# Patient Record
Sex: Female | Born: 1995 | Hispanic: Yes | Marital: Single | Smoking: Never smoker
Health system: Southern US, Community
[De-identification: ages and names within clinical notes are randomized; demographics above are authoritative.]

## PROBLEM LIST (undated history)

## (undated) ENCOUNTER — Inpatient Hospital Stay (HOSPITAL_COMMUNITY): Payer: Self-pay

## (undated) DIAGNOSIS — Z789 Other specified health status: Secondary | ICD-10-CM

## (undated) DIAGNOSIS — O139 Gestational [pregnancy-induced] hypertension without significant proteinuria, unspecified trimester: Secondary | ICD-10-CM

## (undated) HISTORY — DX: Gestational (pregnancy-induced) hypertension without significant proteinuria, unspecified trimester: O13.9

## (undated) HISTORY — PX: DILATION AND EVACUATION: SHX1459

## (undated) HISTORY — PX: NO PAST SURGERIES: SHX2092

---

## 2017-10-09 NOTE — L&D Delivery Note (Addendum)
Delivery Note At 10:54 AM a viable female was delivered via Vaginal, Spontaneous (Presentation: cephalic; ROA ).  APGAR: 8, 9; weight  .   Placenta status: spontaneous, intact.  Cord: 3VC  Anesthesia:  epidural Episiotomy: None Lacerations: 1st degree Suture Repair: 2.0 vicryl rapide Est. Blood Loss (mL):   Mom to postpartum.  Baby to Couplet care / Skin to Skin.  Upon arrival patient was complete and pushing. She pushed with good maternal effort to deliver a healthy baby boy. Baby delivered without difficulty with body cord x1, was noted to have good tone and place on maternal abdomen for oral suctioning, drying and stimulation. Delayed cord clamping performed. Placenta delivered intact with 3V cord. Vaginal canal and perineum was inspected and found to have one hemostatic laceration of the right vaginal wall and a first degree perineal laceration that was repaired with a figure 8. Pitocin was started and uterus massaged until bleeding slowed. Counts of sharps, instruments, and lap pads were all correct.   Mirian Mo, MD, PGY-1 06/14/2018, 11:13 AM  Midwife attestation: I was gloved and present for delivery in its entirety and I agree with the above resident's note.  Donette Larry, CNM 2:54 PM

## 2018-05-08 ENCOUNTER — Inpatient Hospital Stay (HOSPITAL_COMMUNITY)
Admission: AD | Admit: 2018-05-08 | Discharge: 2018-05-08 | Disposition: A | Discharge status: Home / Self Care | Payer: Medicaid Other | Source: Ambulatory Visit | Attending: Family Medicine | Admitting: Family Medicine

## 2018-05-08 ENCOUNTER — Encounter: Payer: Self-pay | Admitting: Student

## 2018-05-08 ENCOUNTER — Other Ambulatory Visit: Payer: Self-pay

## 2018-05-08 DIAGNOSIS — Z0371 Encounter for suspected problem with amniotic cavity and membrane ruled out: Secondary | ICD-10-CM

## 2018-05-08 DIAGNOSIS — R102 Pelvic and perineal pain: Secondary | ICD-10-CM | POA: Diagnosis present

## 2018-05-08 DIAGNOSIS — O0933 Supervision of pregnancy with insufficient antenatal care, third trimester: Secondary | ICD-10-CM | POA: Diagnosis not present

## 2018-05-08 DIAGNOSIS — Z3A33 33 weeks gestation of pregnancy: Secondary | ICD-10-CM | POA: Diagnosis not present

## 2018-05-08 DIAGNOSIS — O4703 False labor before 37 completed weeks of gestation, third trimester: Secondary | ICD-10-CM

## 2018-05-08 HISTORY — DX: Other specified health status: Z78.9

## 2018-05-08 LAB — URINALYSIS, ROUTINE W REFLEX MICROSCOPIC
BILIRUBIN URINE: NEGATIVE
Glucose, UA: NEGATIVE mg/dL
HGB URINE DIPSTICK: NEGATIVE
KETONES UR: 20 mg/dL — AB
Leukocytes, UA: NEGATIVE
NITRITE: NEGATIVE
Protein, ur: NEGATIVE mg/dL
SPECIFIC GRAVITY, URINE: 1.004 — AB (ref 1.005–1.030)
pH: 9 — ABNORMAL HIGH (ref 5.0–8.0)

## 2018-05-08 LAB — WET PREP, GENITAL
Clue Cells Wet Prep HPF POC: NONE SEEN
Trich, Wet Prep: NONE SEEN
YEAST WET PREP: NONE SEEN

## 2018-05-08 LAB — RAPID URINE DRUG SCREEN, HOSP PERFORMED
Amphetamines: NOT DETECTED
BARBITURATES: NOT DETECTED
Benzodiazepines: NOT DETECTED
COCAINE: NOT DETECTED
OPIATES: NOT DETECTED
TETRAHYDROCANNABINOL: NOT DETECTED

## 2018-05-08 LAB — AMNISURE RUPTURE OF MEMBRANE (ROM) NOT AT ARMC: Amnisure ROM: NEGATIVE

## 2018-05-08 LAB — POCT FERN TEST: POCT FERN TEST: NEGATIVE

## 2018-05-08 MED ORDER — NIFEDIPINE 10 MG PO CAPS
10.0000 mg | ORAL_CAPSULE | ORAL | Status: DC | PRN
  Administered 2018-05-08: 10 mg via ORAL
  Filled 2018-05-08: qty 1

## 2018-05-08 NOTE — MAU Provider Note (Signed)
Chief Complaint:  Contractions   First Provider Initiated Contact with Patient 05/08/18 1447     HPI: Christine Macdonald is a 22 y.o. G1P0 at [redacted]w[redacted]d who presents to maternity admissions via EMS reporting pelvic pressure & vaginal discharge. Symptoms began while at the swimming pool today. Noticed leaking of fluid after getting out of the pool. Can't tell if leaking has continued. Also reports generalized abdominal pain and abdominal tightening that comes and goes. Can't tell how frequent it is. Denies vaginal bleeding, n/v/d, or dysuria. Positive fetal movement.  Moved from [place] in April & has not been able to start prenatal care since being in the area.  Has not eaten since last night but states she has been drinking water while at the pool.   Location: abdomen Quality: sharp Severity: 10/10 in pain scale Duration: 1 hour Timing: intermittent Modifying factors: none Associated signs and symptoms: leaking of fluid  Pregnancy Course:   Past Medical History:  Diagnosis Date  . Medical history non-contributory    OB History  Gravida Para Term Preterm AB Living  1            SAB TAB Ectopic Multiple Live Births               # Outcome Date GA Lbr Len/2nd Weight Sex Delivery Anes PTL Lv  1 Current            Past Surgical History:  Procedure Laterality Date  . NO PAST SURGERIES     No family history on file. Social History   Tobacco Use  . Smoking status: Not on file  Substance Use Topics  . Alcohol use: Not on file  . Drug use: Not on file   No Known Allergies No medications prior to admission.    I have reviewed patient's Past Medical Hx, Surgical Hx, Family Hx, Social Hx, medications and allergies.   ROS:  Review of Systems  Physical Exam   Patient Vitals for the past 24 hrs:  BP Temp Temp src Pulse Resp  05/08/18 1725 116/72 98.7 F (37.1 C) Oral (!) 106 18  05/08/18 1444 123/69 98.3 F (36.8 C) Oral 82 20    Constitutional: Well-developed,  well-nourished female in no acute distress.  Cardiovascular: normal rate & rhythm, no murmur Respiratory: normal effort, lung sounds clear throughout GI: Abd soft, non-tender, gravid appropriate for gestational age. Pos BS x 4 MS: Extremities nontender, no edema, normal ROM Neurologic: Alert and oriented x 4.  GU:      Pelvic: No pooling of fluid. Some thick white discharge. Cervix pink &  smooth.   Dilation: Closed Effacement (%): Thick Cervical Position: Middle Presentation: Undeterminable Exam by:: E Lawerence NP  NST:  Baseline: 135 bpm, Variability: Good {> 6 bpm), Accelerations: Reactive and Decelerations: Absent   Labs: Results for orders placed or performed during the hospital encounter of 05/08/18 (from the past 24 hour(s))  POCT fern test     Status: None   Collection Time: 05/08/18  3:07 PM  Result Value Ref Range   POCT Fern Test Negative = intact amniotic membranes   Wet prep, genital     Status: Abnormal   Collection Time: 05/08/18  3:25 PM  Result Value Ref Range   Yeast Wet Prep HPF POC NONE SEEN NONE SEEN   Trich, Wet Prep NONE SEEN NONE SEEN   Clue Cells Wet Prep HPF POC NONE SEEN NONE SEEN   WBC, Wet Prep HPF POC MODERATE (A) NONE  SEEN   Sperm MANY BACTERIA SEEN   Amnisure rupture [facility] membrane (rom)not [facility]     Status: None   Collection Time: 05/08/18  3:35 PM  Result Value Ref Range   Amnisure ROM NEGATIVE   Urinalysis, Routine w reflex microscopic     Status: Abnormal   Collection Time: 05/08/18  4:12 PM  Result Value Ref Range   Color, Urine STRAW (A) YELLOW   APPearance CLEAR CLEAR   Specific Gravity, Urine 1.004 (L) 1.005 - 1.030   pH 9.0 (H) 5.0 - 8.0   Glucose, UA NEGATIVE NEGATIVE mg/dL   Hgb urine dipstick NEGATIVE NEGATIVE   Bilirubin Urine NEGATIVE NEGATIVE   Ketones, ur 20 (A) NEGATIVE mg/dL   Protein, ur NEGATIVE NEGATIVE mg/dL   Nitrite NEGATIVE NEGATIVE   Leukocytes, UA NEGATIVE NEGATIVE  Rapid urine drug screen (hospital performed)      Status: None   Collection Time: 05/08/18  4:14 PM  Result Value Ref Range   Opiates NONE DETECTED NONE DETECTED   Cocaine NONE DETECTED NONE DETECTED   Benzodiazepines NONE DETECTED NONE DETECTED   Amphetamines NONE DETECTED NONE DETECTED   Tetrahydrocannabinol NONE DETECTED NONE DETECTED   Barbiturates NONE DETECTED NONE DETECTED    Imaging:  No results found.  MAU Course: Orders Placed This Encounter  Procedures  . Wet prep, genital  . Culture, beta strep (group b only)  . [facility]  . Urinalysis, Routine w reflex microscopic  . Rapid urine drug screen (hospital performed)  . POCT fern test  . Discharge patient   Meds ordered this encounter  Medications  . NIFEdipine (PROCARDIA) capsule 10 mg    MDM: Reactive tracing SSE performed, no pooling of fluid, fern negative Cervix closed/thick Initially EDD made pt 377w5d, pt states final DD given by her OB in [place] based on ultrasound is 9/13 making her 3625w5d today Procardia 10 mg PO x 1 dose given & pt reports resolution of pain GC/CT, wet prep, UDS collected  Assessment: 1. Preterm uterine contractions in third trimester, antepartum   2. [redacted] weeks gestation of pregnancy   3. Limited prenatal care in third trimester   4. Encounter for suspected PROM, with rupture of membranes not found     Plan: Discharge home in stable condition.  Preterm Labor precautions and fetal kick counts Msg sent to office to schedule new ob visit  Follow-up Information    [facility]   Specialty:  Obstetrics and Gynecology Contact information: [address] [place] [id number] [phone]          Allergies as of 05/08/2018   No Known Allergies     Medication List    You have not been prescribed any medications.     Judeth HornLawrence, Reena Borromeo, NP 05/08/2018 6:45 PM

## 2018-05-08 NOTE — Discharge Instructions (Signed)

## 2018-05-08 NOTE — MAU Note (Signed)
Pt states she began feeling UCs approx 1hr ago.  Pt arrived via EMS & states that upon transfer to MAU stretcher she noticed a gush of fluid.  On visual exam, no fluid noted at perineum.  States +FM.  Has not had PNC since 01/2018 d/t insurance reasons.

## 2018-05-10 LAB — CULTURE, BETA STREP (GROUP B ONLY)

## 2018-05-10 LAB — GC/CHLAMYDIA PROBE AMP (~~LOC~~) NOT AT ARMC
CHLAMYDIA, DNA PROBE: NEGATIVE
Neisseria Gonorrhea: NEGATIVE

## 2018-05-21 ENCOUNTER — Ambulatory Visit (INDEPENDENT_AMBULATORY_CARE_PROVIDER_SITE_OTHER): Payer: Medicaid Other | Admitting: Student

## 2018-05-21 ENCOUNTER — Encounter: Payer: Self-pay | Admitting: Student

## 2018-05-21 ENCOUNTER — Ambulatory Visit: Payer: Self-pay | Admitting: Clinical

## 2018-05-21 VITALS — BP 126/86 | HR 100 | Ht 62.0 in | Wt 156.0 lb

## 2018-05-21 DIAGNOSIS — Z3403 Encounter for supervision of normal first pregnancy, third trimester: Secondary | ICD-10-CM | POA: Diagnosis present

## 2018-05-21 DIAGNOSIS — Z3400 Encounter for supervision of normal first pregnancy, unspecified trimester: Secondary | ICD-10-CM

## 2018-05-21 DIAGNOSIS — R030 Elevated blood-pressure reading, without diagnosis of hypertension: Secondary | ICD-10-CM | POA: Diagnosis not present

## 2018-05-21 DIAGNOSIS — Z23 Encounter for immunization: Secondary | ICD-10-CM

## 2018-05-21 DIAGNOSIS — K5900 Constipation, unspecified: Secondary | ICD-10-CM | POA: Diagnosis not present

## 2018-05-21 LAB — POCT URINALYSIS DIP (DEVICE)
BILIRUBIN URINE: NEGATIVE
GLUCOSE, UA: NEGATIVE mg/dL
HGB URINE DIPSTICK: NEGATIVE
Ketones, ur: NEGATIVE mg/dL
Nitrite: NEGATIVE
Protein, ur: NEGATIVE mg/dL
Specific Gravity, Urine: 1.01 (ref 1.005–1.030)
UROBILINOGEN UA: 0.2 mg/dL (ref 0.0–1.0)
pH: 7.5 (ref 5.0–8.0)

## 2018-05-21 NOTE — Progress Notes (Signed)
Subjective:   Christine Macdonald is a 22 y.o. G1P0 at 31w4dby --/19 being seen today for her first obstetrical visit.  Her obstetrical history is significant for none. Patient does intend to breast feed. Pregnancy history fully reviewed.  Patient reports no complaints. She has had her glucose drink and reports recent episode of nausea. Also has trouble with constipation. Has had this outside of pregnancy but worsening. Usually goes once per week.   HISTORY: OB History  Gravida Para Term Preterm AB Living  1 0 0 0 0 0  SAB TAB Ectopic Multiple Live Births  0 0 0 0 0    # Outcome Date GA Lbr Len/2nd Weight Sex Delivery Anes PTL Lv  1 Current            Last pap smear - unknown. Patient needs postpartum pap smear.   Past Medical History:  Diagnosis Date  . Medical history non-contributory    Past Surgical History:  Procedure Laterality Date  . NO PAST SURGERIES     History reviewed. No pertinent family history. Social History   Tobacco Use  . Smoking status: Never Smoker  . Smokeless tobacco: Never Used  Substance Use Topics  . Alcohol use: Not Currently  . Drug use: Never   Allergies  Allergen Reactions  . Quetiapine Fumarate Hives   Current Outpatient Medications on File Prior to Visit  Medication Sig Dispense Refill  . PRENATAL 27-1 MG TABS Take 1 tablet by mouth daily.     No current facility-administered medications on file prior to visit.    Exam   Vitals:   05/21/18 0849 05/21/18 0850 05/21/18 0906  BP: (!) 146/101  126/86  Pulse: (!) 103  100  Weight: 156 lb (70.8 kg)    Height:  _0  (1.575 m)    Fetal Heart Rate (bpm): 138  Fundal Height: 35   Pelvic Exam:  deferred   Bony Pelvis: average  System: General: well-developed, well-nourished female in no acute distress   Breast:  deferred    Skin: normal coloration and turgor, no rashes   Neurologic: oriented, normal, negative, normal mood   Extremities: normal strength, tone, and muscle  mass, ROM of all joints is normal   HEENT PERRL, extraocular movement intact and sclera clear, anicteric   Mouth/Teeth mucous membranes moist, pharynx normal without lesions and dental hygiene good   Neck supple and no masses   Cardiovascular: Regular heart rate   Respiratory:  no respiratory distress   Abdomen: soft, non-tender     Assessment:   Pregnancy: G1P0 Patient Active Problem List   Diagnosis Date Noted  . Supervision of normal first pregnancy, antepartum 05/21/2018  . Elevated BP without diagnosis of hypertension 05/21/2018    Plan:  1. Encounter for supervision of normal first pregnancy in third trimester: [place] prenatal [place]. [facility]  - Obstetric Panel, Including HIV - SMN1 COPY NUMBER ANALYSIS (SMA Carrier Screen) - Hemoglobinopathy Evaluation - Cystic fibrosis gene test - Glucose Tolerance, 1 Hour - attempted but patient vomited Glucola so A1C drawn  - [place] OB COMP + 14 WK; Future - Tdap today - will need to repeat GBS swab towards end of pregnancy  - Continue prenatal vitamins - ultrasound discussed - to be done next week  - The nature of [facility] MDs and other Advanced Practice Providers was explained  to patient; also emphasized that residents, students are part of our team. Routine obstetric precautions reviewed. - met with clinical social worker to review resources  - Problem list reviewed and updated.  2. Elevated BP without diagnosis of hypertension: Initially elevated blood pressure 146/101 but normal on repeat. Patient reports history of white-coat hypertension. Denies headaches, vision changes, abdominal pain, LE swelling.  - Comp Met (CMET) - Protein / creatinine ratio, urine  3. Constipation: Acute on chronic.  - recommended hydration, dried apricots - discussed use of medications like Colace if worsening  Lynton Crescenzo S. Juleen China,  DO

## 2018-05-21 NOTE — Patient Instructions (Signed)
Third Trimester of Pregnancy The third trimester is from week 28 through week 40 (months 7 through 9). The third trimester is a time when the unborn baby (fetus) is growing rapidly. At the end of the ninth month, the fetus is about 20 inches in length and weighs 6-10 pounds. Body changes during your third trimester Your body will continue to go through many changes during pregnancy. The changes vary from woman to woman. During the third trimester:  Your weight will continue to increase. You can expect to gain 25-35 pounds (11-16 kg) by the end of the pregnancy.  You may begin to get stretch marks on your hips, abdomen, and breasts.  You may urinate more often because the fetus is moving lower into your pelvis and pressing on your bladder.  You may develop or continue to have heartburn. This is caused by increased hormones that slow down muscles in the digestive tract.  You may develop or continue to have constipation because increased hormones slow digestion and cause the muscles that push waste through your intestines to relax.  You may develop hemorrhoids. These are swollen veins (varicose veins) in the rectum that can itch or be painful.  You may develop swollen, bulging veins (varicose veins) in your legs.  You may have increased body aches in the pelvis, back, or thighs. This is due to weight gain and increased hormones that are relaxing your joints.  You may have changes in your hair. These can include thickening of your hair, rapid growth, and changes in texture. Some women also have hair loss during or after pregnancy, or hair that feels dry or thin. Your hair will most likely return to normal after your baby is born.  Your breasts will continue to grow and they will continue to become tender. A yellow fluid (colostrum) may leak from your breasts. This is the first milk you are producing for your baby.  Your belly button may stick out.  You may notice more swelling in your hands,  face, or ankles.  You may have increased tingling or numbness in your hands, arms, and legs. The skin on your belly may also feel numb.  You may feel short of breath because of your expanding uterus.  You may have more problems sleeping. This can be caused by the size of your belly, increased need to urinate, and an increase in your body's metabolism.  You may notice the fetus "dropping," or moving lower in your abdomen (lightening).  You may have increased vaginal discharge.  You may notice your joints feel loose and you may have pain around your pelvic bone.  What to expect at prenatal visits You will have prenatal exams every 2 weeks until week 36. Then you will have weekly prenatal exams. During a routine prenatal visit:  You will be weighed to make sure you and the baby are growing normally.  Your blood pressure will be taken.  Your abdomen will be measured to track your baby's growth.  The fetal heartbeat will be listened to.  Any test results from the previous visit will be discussed.  You may have a cervical check near your due date to see if your cervix has softened or thinned (effaced).  You will be tested for Group B streptococcus. This happens between 35 and 37 weeks.  Your health care provider may ask you:  What your birth plan is.  How you are feeling.  If you are feeling the baby move.  If you have had   any abnormal symptoms, such as leaking fluid, bleeding, severe headaches, or abdominal cramping.  If you are using any tobacco products, including cigarettes, chewing tobacco, and electronic cigarettes.  If you have any questions.  Other tests or screenings that may be performed during your third trimester include:  Blood tests that check for low iron levels (anemia).  Fetal testing to check the health, activity level, and growth of the fetus. Testing is done if you have certain medical conditions or if there are problems during the  pregnancy.  Nonstress test (NST). This test checks the health of your baby to make sure there are no signs of problems, such as the baby not getting enough oxygen. During this test, a belt is placed around your belly. The baby is made to move, and its heart rate is monitored during movement.  What is false labor? False labor is a condition in which you feel small, irregular tightenings of the muscles in the womb (contractions) that usually go away with rest, changing position, or drinking water. These are called Braxton Hicks contractions. Contractions may last for hours, days, or even weeks before true labor sets in. If contractions come at regular intervals, become more frequent, increase in intensity, or become painful, you should see your health care provider. What are the signs of labor?  Abdominal cramps.  Regular contractions that start at 10 minutes apart and become stronger and more frequent with time.  Contractions that start on the top of the uterus and spread down to the lower abdomen and back.  Increased pelvic pressure and dull back pain.  A watery or bloody mucus discharge that comes from the vagina.  Leaking of amniotic fluid. This is also known as your "water breaking." It could be a slow trickle or a gush. Let your health care provider know if it has a color or strange odor. If you have any of these signs, call your health care provider right away, even if it is before your due date. Follow these instructions at home: Medicines  Follow your health care provider's instructions regarding medicine use. Specific medicines may be either safe or unsafe to take during pregnancy.  Take a prenatal vitamin that contains at least 600 micrograms (mcg) of folic acid.  If you develop constipation, try taking a stool softener if your health care provider approves. Eating and drinking  Eat a balanced diet that includes fresh fruits and vegetables, whole grains, good sources of protein  such as meat, eggs, or tofu, and low-fat dairy. Your health care provider will help you determine the amount of weight gain that is right for you.  Avoid raw meat and uncooked cheese. These carry germs that can cause birth defects in the baby.  If you have low calcium intake from food, talk to your health care provider about whether you should take a daily calcium supplement.  Eat four or five small meals rather than three large meals a day.  Limit foods that are high in fat and processed sugars, such as fried and sweet foods.  To prevent constipation: ? Drink enough fluid to keep your urine clear or pale yellow. ? Eat foods that are high in fiber, such as fresh fruits and vegetables, whole grains, and beans. Activity  Exercise only as directed by your health care provider. Most women can continue their usual exercise routine during pregnancy. Try to exercise for 30 minutes at least 5 days a week. Stop exercising if you experience uterine contractions.  Avoid heavy   lifting.  Do not exercise in extreme heat or humidity, or at high altitudes.  Wear low-heel, comfortable shoes.  Practice good posture.  You may continue to have sex unless your health care provider tells you otherwise. Relieving pain and discomfort  Take frequent breaks and rest with your legs elevated if you have leg cramps or low back pain.  Take warm sitz baths to soothe any pain or discomfort caused by hemorrhoids. Use hemorrhoid cream if your health care provider approves.  Wear a good support bra to prevent discomfort from breast tenderness.  If you develop varicose veins: ? Wear support pantyhose or compression stockings as told by your healthcare provider. ? Elevate your feet for 15 minutes, 3-4 times a day. Prenatal care  Write down your questions. Take them to your prenatal visits.  Keep all your prenatal visits as told by your health care provider. This is important. Safety  Wear your seat belt at  all times when driving.  Make a list of emergency phone numbers, including numbers for family, friends, the hospital, and police and fire departments. General instructions  Avoid cat litter boxes and soil used by cats. These carry germs that can cause birth defects in the baby. If you have a cat, ask someone to clean the litter box for you.  Do not travel far distances unless it is absolutely necessary and only with the approval of your health care provider.  Do not use hot tubs, steam rooms, or saunas.  Do not drink alcohol.  Do not use any products that contain nicotine or tobacco, such as cigarettes and e-cigarettes. If you need help quitting, ask your health care provider.  Do not use any medicinal herbs or unprescribed drugs. These chemicals affect the formation and growth of the baby.  Do not douche or use tampons or scented sanitary pads.  Do not cross your legs for long periods of time.  To prepare for the arrival of your baby: ? Take prenatal classes to understand, practice, and ask questions about labor and delivery. ? Make a trial run to the hospital. ? Visit the hospital and tour the maternity area. ? Arrange for maternity or paternity leave through employers. ? Arrange for family and friends to take care of pets while you are in the hospital. ? Purchase a rear-facing car seat and make sure you know how to install it in your car. ? Pack your hospital bag. ? Prepare the baby's nursery. Make sure to remove all pillows and stuffed animals from the baby's crib to prevent suffocation.  Visit your dentist if you have not gone during your pregnancy. Use a soft toothbrush to brush your teeth and be gentle when you floss. Contact a health care provider if:  You are unsure if you are in labor or if your water has broken.  You become dizzy.  You have mild pelvic cramps, pelvic pressure, or nagging pain in your abdominal area.  You have lower back pain.  You have persistent  nausea, vomiting, or diarrhea.  You have an unusual or bad smelling vaginal discharge.  You have pain when you urinate. Get help right away if:  Your water breaks before 37 weeks.  You have regular contractions less than 5 minutes apart before 37 weeks.  You have a fever.  You are leaking fluid from your vagina.  You have spotting or bleeding from your vagina.  You have severe abdominal pain or cramping.  You have rapid weight loss or weight gain.    You have shortness of breath with chest pain.  You notice sudden or extreme swelling of your face, hands, ankles, feet, or legs.  Your baby makes fewer than 10 movements in 2 hours.  You have severe headaches that do not go away when you take medicine.  You have vision changes. Summary  The third trimester is from week 28 through week 40, months 7 through 9. The third trimester is a time when the unborn baby (fetus) is growing rapidly.  During the third trimester, your discomfort may increase as you and your baby continue to gain weight. You may have abdominal, leg, and back pain, sleeping problems, and an increased need to urinate.  During the third trimester your breasts will keep growing and they will continue to become tender. A yellow fluid (colostrum) may leak from your breasts. This is the first milk you are producing for your baby.  False labor is a condition in which you feel small, irregular tightenings of the muscles in the womb (contractions) that eventually go away. These are called Braxton Hicks contractions. Contractions may last for hours, days, or even weeks before true labor sets in.  Signs of labor can include: abdominal cramps; regular contractions that start at 10 minutes apart and become stronger and more frequent with time; watery or bloody mucus discharge that comes from the vagina; increased pelvic pressure and dull back pain; and leaking of amniotic fluid. This information is not intended to replace advice  given to you by your health care provider. Make sure you discuss any questions you have with your health care provider. Document Released: 09/19/2001 Document Revised: 03/02/2016 Document Reviewed: 11/26/2012 [employer] Interactive Patient Education  2017 [employer]  

## 2018-05-21 NOTE — BH Specialist Note (Deleted)
Error

## 2018-05-21 NOTE — BH Specialist Note (Signed)
Integrated Behavioral Health Initial Visit  MRN: [id number] Name: Christine Macdonald  Number of Integrated Behavioral Health Clinician visits:: 1/6 Session Start time: 9:28  Session End time: 9:38 Total time: 10 minutes  Type of Service: Integrated Behavioral Health- Individual/Family Interpretor:No. Interpretor Name and Language: n/a   Warm Hand Off Completed.       SUBJECTIVE: Christine Cavaabitha A Bogart is a 22 y.o. female accompanied by Partner/Significant Other Patient was referred by Judeth HornErin Lawrence, NP for initial OB intro to integrated behavioral health services. Patient reports the following symptoms/concerns: Patient states she has no particular concerns today.  Duration of problem: n/a; Severity of problem: n/a  OBJECTIVE: Mood: Normal and Affect: Appropriate Risk of harm to self or others: No plan to harm self or others  LIFE CONTEXT: Family and Social: - School/Work: - Self-Care: - Life Changes: Current pregnancy and move to [place] from [place]  GOALS ADDRESSED: n/a  INTERVENTIONS: Interventions utilized: Link to [employer]  Standardized Assessments completed: GAD-7 and PHQ 9  ASSESSMENT: Patient currently experiencing Supervision of normal first  Pregnancy, antepartum   Patient may benefit from initial OB introduction to integrated behavioral health services.  PLAN: 1. Follow up with behavioral health clinician on : As needed 2. Behavioral recommendations:  n/a 3. Referral(s): [facility] 4. "From scale of 1-10, how likely are you to follow plan?": 10  Rae LipsJamie C Maxine Huynh, LCSW  Depression screen [facility] 2/9 05/21/2018  Decreased Interest 0  Down, Depressed, Hopeless 0  PHQ - 2 Score 0  Altered sleeping 2  Tired, decreased energy 0  Change in appetite 1  Feeling bad or failure about yourself  0  Trouble concentrating 0  Moving slowly or fidgety/restless 0  Suicidal thoughts 0  PHQ-9 Score 3   GAD 7 : Generalized  Anxiety Score 05/21/2018  Nervous, Anxious, on Edge 0  Control/stop worrying 0  Worry too much - different things 0  Trouble relaxing 0  Restless 0  Easily annoyed or irritable 0  Afraid - awful might happen 0  Total GAD 7 Score 0

## 2018-05-22 LAB — PROTEIN / CREATININE RATIO, URINE
CREATININE, UR: 25.1 mg/dL
PROTEIN/CREAT RATIO: 159 mg/g{creat} (ref 0–200)
Protein, Ur: 4 mg/dL

## 2018-05-22 LAB — HEMOGLOBIN A1C
Est. average glucose Bld gHb Est-mCnc: 100 mg/dL
Hgb A1c MFr Bld: 5.1 % (ref 4.8–5.6)

## 2018-05-27 ENCOUNTER — Ambulatory Visit (HOSPITAL_COMMUNITY)
Admission: RE | Admit: 2018-05-27 | Discharge: 2018-05-27 | Disposition: A | Discharge status: Home / Self Care | Payer: Medicaid Other | Source: Ambulatory Visit | Attending: Student | Admitting: Student

## 2018-05-27 DIAGNOSIS — Z363 Encounter for antenatal screening for malformations: Secondary | ICD-10-CM | POA: Diagnosis not present

## 2018-05-27 DIAGNOSIS — O0932 Supervision of pregnancy with insufficient antenatal care, second trimester: Secondary | ICD-10-CM | POA: Insufficient documentation

## 2018-05-27 DIAGNOSIS — Z3403 Encounter for supervision of normal first pregnancy, third trimester: Secondary | ICD-10-CM

## 2018-05-27 DIAGNOSIS — Z3A36 36 weeks gestation of pregnancy: Secondary | ICD-10-CM

## 2018-05-28 ENCOUNTER — Ambulatory Visit (INDEPENDENT_AMBULATORY_CARE_PROVIDER_SITE_OTHER): Payer: Medicaid Other | Admitting: Student

## 2018-05-28 ENCOUNTER — Inpatient Hospital Stay (HOSPITAL_COMMUNITY)
Admission: AD | Admit: 2018-05-28 | Discharge: 2018-05-28 | Disposition: A | Discharge status: Home / Self Care | Payer: Medicaid Other | Source: Ambulatory Visit | Attending: Obstetrics and Gynecology | Admitting: Obstetrics and Gynecology

## 2018-05-28 VITALS — BP 122/85 | HR 91 | Wt 156.9 lb

## 2018-05-28 DIAGNOSIS — Z3A38 38 weeks gestation of pregnancy: Secondary | ICD-10-CM | POA: Insufficient documentation

## 2018-05-28 DIAGNOSIS — O26893 Other specified pregnancy related conditions, third trimester: Secondary | ICD-10-CM | POA: Insufficient documentation

## 2018-05-28 DIAGNOSIS — Z3400 Encounter for supervision of normal first pregnancy, unspecified trimester: Secondary | ICD-10-CM

## 2018-05-28 DIAGNOSIS — O479 False labor, unspecified: Secondary | ICD-10-CM

## 2018-05-28 DIAGNOSIS — O0933 Supervision of pregnancy with insufficient antenatal care, third trimester: Secondary | ICD-10-CM

## 2018-05-28 DIAGNOSIS — O0930 Supervision of pregnancy with insufficient antenatal care, unspecified trimester: Secondary | ICD-10-CM | POA: Insufficient documentation

## 2018-05-28 DIAGNOSIS — Z3403 Encounter for supervision of normal first pregnancy, third trimester: Secondary | ICD-10-CM

## 2018-05-28 DIAGNOSIS — R030 Elevated blood-pressure reading, without diagnosis of hypertension: Secondary | ICD-10-CM

## 2018-05-28 NOTE — Progress Notes (Addendum)
   PRENATAL VISIT NOTE  Subjective:  Christine Macdonald is a 22 y.o. G1P0 at 4460w4d being seen today for ongoing prenatal care.  She is currently monitored for the following issues for this low-risk pregnancy and has Supervision of normal first pregnancy, antepartum; Elevated BP without diagnosis of hypertension; Constipation; and Late prenatal care on their problem list.   Patient reports no complaints.  Contractions: Irritability. Vag. Bleeding: None.  Movement: Present. Denies leaking of fluid.   The following portions of the patient's history were reviewed and updated as appropriate: allergies, current medications, past family history, past medical history, past social history, past surgical history and problem list. Problem list updated.  Objective:   Vitals:   05/28/18 0827  BP: 122/85  Pulse: 91  Weight: 156 lb 14.4 oz (71.2 kg)    Fetal Status: Fetal Heart Rate (bpm): 142 Fundal Height: 37 cm Movement: Present     General:  Alert, oriented and cooperative. Patient is in no acute distress.  Skin: Skin is warm and dry. No rash noted.   Cardiovascular: Normal heart rate noted  Respiratory: Normal respiratory effort, no problems with respiration noted  Abdomen: Soft, gravid, appropriate for gestational age.  Pain/Pressure: Present     Pelvic: Cervical exam deferred        Extremities: Normal range of motion.  Edema: None  Mental Status: Normal mood and affect. Normal behavior. Normal judgment and thought content.   Assessment and Plan:  Pregnancy: G1P0 at 2060w4d  1. Supervision of normal first pregnancy, antepartum  - Per patient, gestational age was determined by anatomy scan at 18wks. LMP was unknown to mother. -Reviewed print out of labs (not yet in Epic); all normal. RN to abstract.  -Patient clarified she wants Depo -List of peds given -Reviewed US results; normal anatomy -BP reviewed, normal today   - F/u in 1 week  2. Poor prenatal care  Term labor symptoms and  general obstetric precautions including but not limited to vaginal bleeding, contractions, leaking of fluid and fetal movement were reviewed in detail with the patient. Please refer to After Visit Summary for other counseling recommendations.  Return in about 1 week (around 06/04/2018), or LROB.  Future Appointments  Date Time Provider Department Center  06/04/2018 10:55 AM Kathlene CoteWenzel, Julie N, PA-C [facility]  06/11/2018 10:55 AM Marvetta GibbonsBurleson, Brand Maleserri L, NP [facility]  06/18/2018 10:55 AM Marylene LandKooistra, Kathryn Lorraine, CNM [facility]  06/25/2018 10:55 AM Crisoforo OxfordKooistra, Charlesetta GaribaldiKathryn Lorraine, CNM [facility]    Margarita RanaHarrison D Ahmira Boisselle, Student-PA 9:05am  Patient doing well; no complaints. BP normal today; confirmed Depo shot. Anticipatory guidance about next weeks of pregnancy given. Return in 1 week.   I confirm that I have verified the information documented in the PA's note and that I have also personally reperformed the physical exam and all medical decision making activities. Luna KitchensKathryn Kooistra

## 2018-05-28 NOTE — Patient Instructions (Signed)
AREA PEDIATRIC/FAMILY PRACTICE PHYSICIANS  [facility] [address], [address] [place], [place]  [address] Phone - [phone]   Fax - [phone]  [facility] [address] [place], [place] [address] Phone - [phone]   Fax - [phone]  [facility] [address] [place], [place]  [address] Phone - [phone]   Fax - [phone]  [facility] [address] [place], [place]  [address] Phone - [phone]   Fax - [phone]  [facility] [address] [place], [place]  [address] Phone - [phone]   Fax - [phone]  [facility] [address] [place], [place]  [address] Phone - [phone]   Fax - [phone]  [facility] [address] [place], [place]  [address] Phone - [phone]   Fax - [phone]  [facility] [address] [place], [place]  [address] Phone - [phone]   Fax - [phone]  [facility] [address] [place], [place]  [address] Phone - [phone]   Fax - [phone] [facility] [address] [place], [place]  [address] Phone - [phone]   Fax - [phone]  [facility] [address] [place], [place]  [address] Phone - [phone]   Fax - [phone]  [facility] [address] [place], [place]  [address] Phone - [phone]   Fax - [phone]  [facility] [address] [place], [place]  [address] Phone - [phone]   Fax - [phone]  [facility] [address] [place], [place]  [address] Phone - [phone]  [facility] [address] [place], [place]  [address] Phone - [phone]   Fax - [phone]  [facility] [address] [place], [place]  [address] Phone - [phone]   Fax - [phone]  [facility] [address] [place], [place]  [address] Phone - [phone]   Fax - [phone]   [facility] [address] [place], [place]  [address] Phone - [phone]   Fax - [phone]   [facility] [address] [place], [place]  [address] Phone - [phone]   Fax - [phone]  [facility] [address] [place], [place]  [address] Phone - [phone]   Fax - [phone]  [facility] [address] [place], [place]  [address] Phone - [phone]   Fax - [phone]  [facility] [address] [place], [place]  [address] Phone - [phone]   Fax - [phone]  [facility] [address] [place], [place]  [address] Phone - [phone]   Fax - [phone] [facility] [address] [place], [place]  [address] Phone - [phone]   Fax - [phone]  [facility] [address] [place], [place]  [address] Phone - [phone]   Fax - [phone]  [facility] [address] [place], [place]  [address] Phone - [phone]   Fax - [phone]  [facility] Charlene Flemming MD [address] [place] [address] Phone [phone]  Fax [phone]   

## 2018-05-28 NOTE — MAU Note (Signed)
I have communicated with Dr. Parke SimmersBland and reviewed vital signs:  Vitals:   05/28/18 2005  BP: 123/85  Pulse: 90  Resp: 18  Temp: 98.5 F (36.9 C)  SpO2: 97%    Vaginal exam:  Dilation: Closed Effacement (%): Thick Presentation: Undeterminable Exam by:: Tylor Gambrill RN,   Also reviewed contraction pattern and that non-stress test is reactive.  It has been documented that patient reports one contractions in the 30 minutes she was on the monitor; cervix is closed not indicating active labor.  Patient denies any other complaints.  Based on this report provider has given order for discharge.  A discharge order and diagnosis entered by a provider.   Labor discharge instructions reviewed with patient.

## 2018-05-28 NOTE — Progress Notes (Signed)
Pt is having a hard time catching her breath. Past Week.

## 2018-05-28 NOTE — Discharge Summary (Signed)
Consulted about discharge for this patient with closed cervix and 1 contraction in last 30min.  No ROM, vital signs reassuring and fetal monitoring strip reviewed with Dr. Earlene PlaterWallace.  Patient cleared for discharge with return precautions issued by MAU  -Dr. Parke SimmersBland

## 2018-05-28 NOTE — MAU Note (Signed)
Pt here for complaints of contractions that started about 1 hour ago. States they are every 4-5 mins. Denies LOF or vaginal bleeding. Reports good fetal movement. States her cervix has not been examined.

## 2018-05-29 LAB — HEMOGLOBINOPATHY EVALUATION
FERRITIN: 10 ng/mL — AB (ref 15–150)
HGB C: 0 %
HGB F QUANT: 0 % (ref 0.0–2.0)
HGB S: 0 %
Hgb A2 Quant: 2 % (ref 1.8–3.2)
Hgb A: 98 % (ref 96.4–98.8)
Hgb Solubility: NEGATIVE
Hgb Variant: 0 %

## 2018-05-29 LAB — COMPREHENSIVE METABOLIC PANEL
ALT: 7 IU/L (ref 0–32)
AST: 12 IU/L (ref 0–40)
Albumin/Globulin Ratio: 1.2 (ref 1.2–2.2)
Albumin: 3.6 g/dL (ref 3.5–5.5)
Alkaline Phosphatase: 148 IU/L — ABNORMAL HIGH (ref 39–117)
BILIRUBIN TOTAL: 0.2 mg/dL (ref 0.0–1.2)
BUN/Creatinine Ratio: 6 — ABNORMAL LOW (ref 9–23)
BUN: 3 mg/dL — ABNORMAL LOW (ref 6–20)
CHLORIDE: 103 mmol/L (ref 96–106)
CO2: 19 mmol/L — ABNORMAL LOW (ref 20–29)
Calcium: 9.1 mg/dL (ref 8.7–10.2)
Creatinine, Ser: 0.5 mg/dL — ABNORMAL LOW (ref 0.57–1.00)
GFR calc non Af Amer: 137 mL/min/{1.73_m2} (ref >59)
GFR, EST AFRICAN AMERICAN: 158 mL/min/{1.73_m2} (ref >59)
GLUCOSE: 108 mg/dL — AB (ref 65–99)
Globulin, Total: 2.9 g/dL (ref 1.5–4.5)
Potassium: 3.6 mmol/L (ref 3.5–5.2)
Sodium: 137 mmol/L (ref 134–144)
TOTAL PROTEIN: 6.5 g/dL (ref 6.0–8.5)

## 2018-05-29 LAB — OBSTETRIC PANEL, INCLUDING HIV
ANTIBODY SCREEN: NEGATIVE
Basophils Absolute: 0 10*3/uL (ref 0.0–0.2)
Basos: 0 %
EOS (ABSOLUTE): 0.1 10*3/uL (ref 0.0–0.4)
Eos: 1 %
HEMATOCRIT: 32.9 % — AB (ref 34.0–46.6)
HIV SCREEN 4TH GENERATION: NONREACTIVE
Hemoglobin: 10.9 g/dL — ABNORMAL LOW (ref 11.1–15.9)
Hepatitis B Surface Ag: NEGATIVE
Immature Grans (Abs): 0.1 10*3/uL (ref 0.0–0.1)
Immature Granulocytes: 1 %
Lymphocytes Absolute: 1.4 10*3/uL (ref 0.7–3.1)
Lymphs: 14 %
MCH: 27.4 pg (ref 26.6–33.0)
MCHC: 33.1 g/dL (ref 31.5–35.7)
MCV: 83 fL (ref 79–97)
MONOCYTES: 8 %
Monocytes Absolute: 0.8 10*3/uL (ref 0.1–0.9)
NEUTROS ABS: 7.9 10*3/uL — AB (ref 1.4–7.0)
Neutrophils: 76 %
Platelets: 219 10*3/uL (ref 150–450)
RBC: 3.98 x10E6/uL (ref 3.77–5.28)
RDW: 13 % (ref 12.3–15.4)
RPR: NONREACTIVE
RUBELLA: 3.1 {index} (ref >0.99)
Rh Factor: POSITIVE
WBC: 10.4 10*3/uL (ref 3.4–10.8)

## 2018-05-29 LAB — GLUCOSE TOLERANCE, 1 HOUR: GLUCOSE, 1HR PP: 96 mg/dL (ref 65–199)

## 2018-05-29 LAB — SMN1 COPY NUMBER ANALYSIS (SMA CARRIER SCREENING)

## 2018-05-29 LAB — CYSTIC FIBROSIS GENE TEST

## 2018-06-04 ENCOUNTER — Ambulatory Visit (INDEPENDENT_AMBULATORY_CARE_PROVIDER_SITE_OTHER): Payer: Medicaid Other | Admitting: Medical

## 2018-06-04 ENCOUNTER — Encounter: Payer: Self-pay | Admitting: Medical

## 2018-06-04 VITALS — BP 131/82 | HR 85 | Wt 158.7 lb

## 2018-06-04 DIAGNOSIS — Z3403 Encounter for supervision of normal first pregnancy, third trimester: Secondary | ICD-10-CM

## 2018-06-04 DIAGNOSIS — Z3400 Encounter for supervision of normal first pregnancy, unspecified trimester: Secondary | ICD-10-CM

## 2018-06-04 DIAGNOSIS — R030 Elevated blood-pressure reading, without diagnosis of hypertension: Secondary | ICD-10-CM

## 2018-06-04 NOTE — Progress Notes (Signed)
   PRENATAL VISIT NOTE  Subjective:  Christine Macdonald is a 22 y.o. G1P0 at 7366w4d being seen today for ongoing prenatal care.  She is currently monitored for the following issues for this low-risk pregnancy and has Supervision of normal first pregnancy, antepartum; Elevated BP without diagnosis of hypertension; Constipation; Late prenatal care; and False labor on their problem list.  Patient reports occasional contractions.  Contractions: Irritability. Vag. Bleeding: None.  Movement: Present. Denies leaking of fluid.   The following portions of the patient's history were reviewed and updated as appropriate: allergies, current medications, past family history, past medical history, past social history, past surgical history and problem list. Problem list updated.  Objective:   Vitals:   06/04/18 1058  BP: 131/82  Pulse: 85  Weight: 158 lb 11.2 oz (72 kg)    Fetal Status: Fetal Heart Rate (bpm): 141 Fundal Height: 37 cm Movement: Present  Presentation: Vertex  General:  Alert, oriented and cooperative. Patient is in no acute distress.  Skin: Skin is warm and dry. No rash noted.   Cardiovascular: Normal heart rate noted  Respiratory: Normal respiratory effort, no problems with respiration noted  Abdomen: Soft, gravid, appropriate for gestational age.  Pain/Pressure: Present     Pelvic: Cervical exam performed Dilation: Fingertip Effacement (%): 20 Station: -3  Extremities: Normal range of motion.  Edema: None  Mental Status: Normal mood and affect. Normal behavior. Normal judgment and thought content.   Assessment and Plan:  Pregnancy: G1P0 at 3166w4d  1. Supervision of normal first pregnancy, antepartum - Occasional contractions, PTL evaluation in MAU 7/31 - false labor - Negative GBS 7/31, will need repeat at next visit if not delivered   2. Elevated BP without diagnosis of hypertension - Normotensive today   Term labor symptoms and general obstetric precautions including but  not limited to vaginal bleeding, contractions, leaking of fluid and fetal movement were reviewed in detail with the patient. Please refer to After Visit Summary for other counseling recommendations.  Return in about 1 week (around 06/11/2018) for LOB.  Future Appointments  Date Time Provider Department Center  06/11/2018 10:55 AM Marvetta GibbonsBurleson, Brand Maleserri L, NP [facility]  06/18/2018 10:55 AM Marylene LandKooistra, Kathryn Lorraine, CNM [facility]  06/25/2018 10:55 AM Crisoforo [place], Charlesetta GaribaldiKathryn Lorraine, CNM [facility]    Vonzella NippleJulie Yunique Dearcos, PA-C

## 2018-06-04 NOTE — Patient Instructions (Signed)
Research childbirth classes and hospital preregistration at [email or web address]  Fetal Movement Counts Patient Name: ________________________________________________ Patient Due Date: ____________________ What is a fetal movement count? A fetal movement count is the number of times that you feel your baby move during a certain amount of time. This may also be called a fetal kick count. A fetal movement count is recommended for every pregnant woman. You may be asked to start counting fetal movements as early as week 28 of your pregnancy. Pay attention to when your baby is most active. You may notice your baby's sleep and wake cycles. You may also notice things that make your baby move more. You should do a fetal movement count:  When your baby is normally most active.  At the same time each day.  A good time to count movements is while you are resting, after having something to eat and drink. How do I count fetal movements? 1. Find a quiet, comfortable area. Sit, or lie down on your side. 2. Write down the date, the start time and stop time, and the number of movements that you felt between those two times. Take this information with you to your health care visits. 3. For 2 hours, count kicks, flutters, swishes, rolls, and jabs. You should feel at least 10 movements during 2 hours. 4. You may stop counting after you have felt 10 movements. 5. If you do not feel 10 movements in 2 hours, have something to eat and drink. Then, keep resting and counting for 1 hour. If you feel at least 4 movements during that hour, you may stop counting. Contact a health care provider if:  You feel fewer than 4 movements in 2 hours.  Your baby is not moving like he or she usually does. Date: ____________ Start time: ____________ Stop time: ____________ Movements: ____________ Date: ____________ Start time: ____________ Stop time: ____________ Movements: ____________ Date: ____________ Start time: ____________  Stop time: ____________ Movements: ____________ Date: ____________ Start time: ____________ Stop time: ____________ Movements: ____________ Date: ____________ Start time: ____________ Stop time: ____________ Movements: ____________ Date: ____________ Start time: ____________ Stop time: ____________ Movements: ____________ Date: ____________ Start time: ____________ Stop time: ____________ Movements: ____________ Date: ____________ Start time: ____________ Stop time: ____________ Movements: ____________ Date: ____________ Start time: ____________ Stop time: ____________ Movements: ____________ This information is not intended to replace advice given to you by your health care provider. Make sure you discuss any questions you have with your health care provider. Document Released: 10/25/2006 Document Revised: 05/24/2016 Document Reviewed: 11/04/2015 Elsevier Interactive Patient Education  2018 Else[employer] Inc[employer]  Braxton Hicks Contractions Contractions of the uterus can occur throughout pregnancy, but they are not always a sign that you are in labor. You may have practice contractions called Braxton Hicks contractions. These false labor contractions are sometimes confused with true labor. What are Braxton Hicks contractions? Braxton Hicks contractions are tightening movements that occur in the muscles of the uterus before labor. Unlike true labor contractions, these contractions do not result in opening (dilation) and thinning of the cervix. Toward the end of pregnancy (32-34 weeks), Braxton Hicks contractions can happen more often and may become stronger. These contractions are sometimes difficult to tell apart from true labor because they can be very uncomfortable. You should not feel embarrassed if you go to the hospital with false labor. Sometimes, the only way to tell if you are in true labor is for your health care provider to look for changes in the cervix. The health care provider will   do a physical  exam and may monitor your contractions. If you are not in true labor, the exam should show that your cervix is not dilating and your water has not broken. If there are other health problems associated with your pregnancy, it is completely safe for you to be sent home with false labor. You may continue to have Braxton Hicks contractions until you go into true labor. How to tell the difference between true labor and false labor True labor  Contractions last 30-70 seconds.  Contractions become very regular.  Discomfort is usually felt in the top of the uterus, and it spreads to the lower abdomen and low back.  Contractions do not go away with walking.  Contractions usually become more intense and increase in frequency.  The cervix dilates and gets thinner. False labor  Contractions are usually shorter and not as strong as true labor contractions.  Contractions are usually irregular.  Contractions are often felt in the front of the lower abdomen and in the groin.  Contractions may go away when you walk around or change positions while lying down.  Contractions get weaker and are shorter-lasting as time goes on.  The cervix usually does not dilate or become thin. Follow these instructions at home:  Take over-the-counter and prescription medicines only as told by your health care provider.  Keep up with your usual exercises and follow other instructions from your health care provider.  Eat and drink lightly if you think you are going into labor.  If Braxton Hicks contractions are making you uncomfortable: ? Change your position from lying down or resting to walking, or change from walking to resting. ? Sit and rest in a tub of warm water. ? Drink enough fluid to keep your urine pale yellow. Dehydration may cause these contractions. ? Do slow and deep breathing several times an hour.  Keep all follow-up prenatal visits as told by your health care provider. This is  important. Contact a health care provider if:  You have a fever.  You have continuous pain in your abdomen. Get help right away if:  Your contractions become stronger, more regular, and closer together.  You have fluid leaking or gushing from your vagina.  You pass blood-tinged mucus (bloody show).  You have bleeding from your vagina.  You have low back pain that you never had before.  You feel your baby's head pushing down and causing pelvic pressure.  Your baby is not moving inside you as much as it used to. Summary  Contractions that occur before labor are called Braxton Hicks contractions, false labor, or practice contractions.  Braxton Hicks contractions are usually shorter, weaker, farther apart, and less regular than true labor contractions. True labor contractions usually become progressively stronger and regular and they become more frequent.  Manage discomfort from Braxton Hicks contractions by changing position, resting in a warm bath, drinking plenty of water, or practicing deep breathing. This information is not intended to replace advice given to you by your health care provider. Make sure you discuss any questions you have with your health care provider. Document Released: 02/08/2017 Document Revised: 02/08/2017 Document Reviewed: 02/08/2017 Elsevier Interactive Patient Education  2018 Elsevier Inc.    

## 2018-06-11 ENCOUNTER — Ambulatory Visit (INDEPENDENT_AMBULATORY_CARE_PROVIDER_SITE_OTHER): Payer: Medicaid Other | Admitting: Nurse Practitioner

## 2018-06-11 VITALS — BP 123/83 | HR 92

## 2018-06-11 DIAGNOSIS — Z3400 Encounter for supervision of normal first pregnancy, unspecified trimester: Secondary | ICD-10-CM

## 2018-06-11 DIAGNOSIS — Z3403 Encounter for supervision of normal first pregnancy, third trimester: Secondary | ICD-10-CM

## 2018-06-11 NOTE — Progress Notes (Addendum)
    Subjective:  Christine Macdonald is a 22 y.o. G1P0 at [redacted]w[redacted]d being seen today for ongoing prenatal care.  She is currently monitored for the following issues for this low-risk pregnancy and has Supervision of normal first pregnancy, antepartum; Elevated BP without diagnosis of hypertension; Constipation; Late prenatal care; and False labor on their problem list.  Patient reports lost mucous plug last night and at 2 am had fluid on underwear.  Contractions: Irritability. Vag. Bleeding: Scant.  Movement: Present. Denies leaking of fluid.  Thought her water may have broken but has not really had any leaking since the one episode of having her clothing wet at 2 am.  The following portions of the patient's history were reviewed and updated as appropriate: allergies, current medications, past family history, past medical history, past social history, past surgical history and problem list. Problem list updated.  Objective:   Vitals:   06/11/18 1123  BP: 123/83  Pulse: 92    Fetal Status: Fetal Heart Rate (bpm): 138 Fundal Height: 37 cm Movement: Present     General:  Alert, oriented and cooperative. Patient is in no acute distress.  Skin: Skin is warm and dry. No rash noted.   Cardiovascular: Normal heart rate noted  Respiratory: Normal respiratory effort, no problems with respiration noted  Abdomen: Soft, gravid, appropriate for gestational age. Pain/Pressure: Present     Pelvic:  Cervical exam performed      speculum exam done.  No pooling.  No leaking with valsalva.  Vagina has no discharge.  No leaking with valsalva.  Cervix closed and long.  GBS done.  Extremities: Normal range of motion.  Edema: None  Mental Status: Normal mood and affect. Normal behavior. Normal judgment and thought content.   Urinalysis:      Assessment and Plan:  Pregnancy: G1P0 at [redacted]w[redacted]d  1. Supervision of normal first pregnancy, antepartum No evidence of ROM  Advised to return to MAU if having any more  leaking or if a fever develops.  - Culture, beta strep (group b only)  Term labor symptoms and general obstetric precautions including but not limited to vaginal bleeding, contractions, leaking of fluid and fetal movement were reviewed in detail with the patient. Please refer to After Visit Summary for other counseling recommendations.  Return in about 1 week (around 06/18/2018).  Nolene Bernheim, RN, MSN, NP-BC Nurse Practitioner, [facility] for [employer], [facility] 06/11/2018 12:45 PM

## 2018-06-11 NOTE — Patient Instructions (Signed)
Braxton Hicks Contractions °Contractions of the uterus can occur throughout pregnancy, but they are not always a sign that you are in labor. You may have practice contractions called Braxton Hicks contractions. These false labor contractions are sometimes confused with true labor. °What are Braxton Hicks contractions? °Braxton Hicks contractions are tightening movements that occur in the muscles of the uterus before labor. Unlike true labor contractions, these contractions do not result in opening (dilation) and thinning of the cervix. Toward the end of pregnancy (32-34 weeks), Braxton Hicks contractions can happen more often and may become stronger. These contractions are sometimes difficult to tell apart from true labor because they can be very uncomfortable. You should not feel embarrassed if you go to the hospital with false labor. °Sometimes, the only way to tell if you are in true labor is for your health care provider to look for changes in the cervix. The health care provider will do a physical exam and may monitor your contractions. If you are not in true labor, the exam should show that your cervix is not dilating and your water has not broken. °If there are other health problems associated with your pregnancy, it is completely safe for you to be sent home with false labor. You may continue to have Braxton Hicks contractions until you go into true labor. °How to tell the difference between true labor and false labor °True labor °· Contractions last 30-70 seconds. °· Contractions become very regular. °· Discomfort is usually felt in the top of the uterus, and it spreads to the lower abdomen and low back. °· Contractions do not go away with walking. °· Contractions usually become more intense and increase in frequency. °· The cervix dilates and gets thinner. °False labor °· Contractions are usually shorter and not as strong as true labor contractions. °· Contractions are usually irregular. °· Contractions  are often felt in the front of the lower abdomen and in the groin. °· Contractions may go away when you walk around or change positions while lying down. °· Contractions get weaker and are shorter-lasting as time goes on. °· The cervix usually does not dilate or become thin. °Follow these instructions at home: °· Take over-the-counter and prescription medicines only as told by your health care provider. °· Keep up with your usual exercises and follow other instructions from your health care provider. °· Eat and drink lightly if you think you are going into labor. °· If Braxton Hicks contractions are making you uncomfortable: °? Change your position from lying down or resting to walking, or change from walking to resting. °? Sit and rest in a tub of warm water. °? Drink enough fluid to keep your urine pale yellow. Dehydration may cause these contractions. °? Do slow and deep breathing several times an hour. °· Keep all follow-up prenatal visits as told by your health care provider. This is important. °Contact a health care provider if: °· You have a fever. °· You have continuous pain in your abdomen. °Get help right away if: °· Your contractions become stronger, more regular, and closer together. °· You have fluid leaking or gushing from your vagina. °· You pass blood-tinged mucus (bloody show). °· You have bleeding from your vagina. °· You have low back pain that you never had before. °· You feel your baby’s head pushing down and causing pelvic pressure. °· Your baby is not moving inside you as much as it used to. °Summary °· Contractions that occur before labor are called Braxton   Hicks contractions, false labor, or practice contractions. °· Braxton Hicks contractions are usually shorter, weaker, farther apart, and less regular than true labor contractions. True labor contractions usually become progressively stronger and regular and they become more frequent. °· Manage discomfort from Braxton Hicks contractions by  changing position, resting in a warm bath, drinking plenty of water, or practicing deep breathing. °This information is not intended to replace advice given to you by your health care provider. Make sure you discuss any questions you have with your health care provider. °Document Released: 02/08/2017 Document Revised: 02/08/2017 Document Reviewed: 02/08/2017 °Elsevier Interactive Patient Education © 2018 [employer] ° °

## 2018-06-13 ENCOUNTER — Encounter (HOSPITAL_COMMUNITY): Payer: Self-pay

## 2018-06-13 ENCOUNTER — Inpatient Hospital Stay (HOSPITAL_COMMUNITY): Payer: Medicaid Other | Admitting: Anesthesiology

## 2018-06-13 ENCOUNTER — Inpatient Hospital Stay (HOSPITAL_COMMUNITY)
Admission: AD | Admit: 2018-06-13 | Discharge: 2018-06-15 | DRG: 807 | Disposition: A | Discharge status: Home / Self Care | Payer: Medicaid Other | Attending: Obstetrics and Gynecology | Admitting: Obstetrics and Gynecology

## 2018-06-13 DIAGNOSIS — O4290 Premature rupture of membranes, unspecified as to length of time between rupture and onset of labor, unspecified weeks of gestation: Secondary | ICD-10-CM | POA: Diagnosis present

## 2018-06-13 DIAGNOSIS — Z8759 Personal history of other complications of pregnancy, childbirth and the puerperium: Secondary | ICD-10-CM | POA: Diagnosis present

## 2018-06-13 DIAGNOSIS — R030 Elevated blood-pressure reading, without diagnosis of hypertension: Secondary | ICD-10-CM | POA: Diagnosis present

## 2018-06-13 DIAGNOSIS — O26893 Other specified pregnancy related conditions, third trimester: Secondary | ICD-10-CM | POA: Diagnosis present

## 2018-06-13 DIAGNOSIS — Z3A38 38 weeks gestation of pregnancy: Secondary | ICD-10-CM | POA: Diagnosis not present

## 2018-06-13 DIAGNOSIS — O4292 Full-term premature rupture of membranes, unspecified as to length of time between rupture and onset of labor: Principal | ICD-10-CM | POA: Diagnosis present

## 2018-06-13 LAB — CBC
HCT: 32.1 % — ABNORMAL LOW (ref 36.0–46.0)
HEMOGLOBIN: 10.6 g/dL — AB (ref 12.0–15.0)
MCH: 27.1 pg (ref 26.0–34.0)
MCHC: 33 g/dL (ref 30.0–36.0)
MCV: 82.1 fL (ref 78.0–100.0)
Platelets: 228 10*3/uL (ref 150–400)
RBC: 3.91 MIL/uL (ref 3.87–5.11)
RDW: 13.1 % (ref 11.5–15.5)
WBC: 11.7 10*3/uL — ABNORMAL HIGH (ref 4.0–10.5)

## 2018-06-13 LAB — RPR: RPR Ser Ql: NONREACTIVE

## 2018-06-13 LAB — ABO/RH: ABO/RH(D): O POS

## 2018-06-13 LAB — GROUP B STREP BY PCR: GROUP B STREP BY PCR: NEGATIVE

## 2018-06-13 LAB — TYPE AND SCREEN
ABO/RH(D): O POS
Antibody Screen: NEGATIVE

## 2018-06-13 LAB — RAPID URINE DRUG SCREEN, HOSP PERFORMED
AMPHETAMINES: NOT DETECTED
Barbiturates: NOT DETECTED
Benzodiazepines: NOT DETECTED
Cocaine: NOT DETECTED
OPIATES: NOT DETECTED
Tetrahydrocannabinol: NOT DETECTED

## 2018-06-13 LAB — OB RESULTS CONSOLE GBS: STREP GROUP B AG: NEGATIVE

## 2018-06-13 LAB — POCT FERN TEST: POCT Fern Test: POSITIVE — AB

## 2018-06-13 MED ORDER — PHENYLEPHRINE 40 MCG/ML (10ML) SYRINGE FOR IV PUSH (FOR BLOOD PRESSURE SUPPORT)
80.0000 ug | PREFILLED_SYRINGE | INTRAVENOUS | Status: DC | PRN
  Filled 2018-06-13: qty 5
  Filled 2018-06-13: qty 10

## 2018-06-13 MED ORDER — EPHEDRINE 5 MG/ML INJ
10.0000 mg | INTRAVENOUS | Status: DC | PRN
  Filled 2018-06-13: qty 2

## 2018-06-13 MED ORDER — SOD CITRATE-CITRIC ACID 500-334 MG/5ML PO SOLN
30.0000 mL | ORAL | Status: DC | PRN
  Filled 2018-06-13: qty 15

## 2018-06-13 MED ORDER — PENICILLIN G 3 MILLION UNITS IVPB - SIMPLE MED
3.0000 10*6.[IU] | INTRAVENOUS | Status: DC
  Administered 2018-06-13: 3 10*6.[IU] via INTRAVENOUS
  Filled 2018-06-13: qty 3
  Filled 2018-06-13: qty 100

## 2018-06-13 MED ORDER — OXYTOCIN 40 UNITS IN LACTATED RINGERS INFUSION - SIMPLE MED
1.0000 m[IU]/min | INTRAVENOUS | Status: DC
  Administered 2018-06-14: 2 m[IU]/min via INTRAVENOUS

## 2018-06-13 MED ORDER — OXYTOCIN BOLUS FROM INFUSION
500.0000 mL | Freq: Once | INTRAVENOUS | Status: AC
  Administered 2018-06-14: 500 mL via INTRAVENOUS

## 2018-06-13 MED ORDER — SODIUM CHLORIDE 0.9 % IV SOLN
5.0000 10*6.[IU] | Freq: Once | INTRAVENOUS | Status: AC
  Administered 2018-06-13: 5 10*6.[IU] via INTRAVENOUS
  Filled 2018-06-13: qty 5

## 2018-06-13 MED ORDER — LACTATED RINGERS IV SOLN: 500.0000 mL | INTRAVENOUS | Status: DC | PRN

## 2018-06-13 MED ORDER — FENTANYL CITRATE (PF) 100 MCG/2ML IJ SOLN
100.0000 ug | INTRAMUSCULAR | Status: DC | PRN
  Administered 2018-06-13 (×5): 100 ug via INTRAVENOUS
  Filled 2018-06-13 (×5): qty 2

## 2018-06-13 MED ORDER — PHENYLEPHRINE 40 MCG/ML (10ML) SYRINGE FOR IV PUSH (FOR BLOOD PRESSURE SUPPORT)
80.0000 ug | PREFILLED_SYRINGE | INTRAVENOUS | Status: DC | PRN
  Filled 2018-06-13: qty 5

## 2018-06-13 MED ORDER — LACTATED RINGERS IV SOLN
500.0000 mL | Freq: Once | INTRAVENOUS | Status: AC
  Administered 2018-06-13: 500 mL via INTRAVENOUS

## 2018-06-13 MED ORDER — LIDOCAINE HCL (PF) 1 % IJ SOLN
30.0000 mL | INTRAMUSCULAR | Status: DC | PRN
  Filled 2018-06-13: qty 30

## 2018-06-13 MED ORDER — LACTATED RINGERS IV SOLN: 500.0000 mL | Freq: Once | INTRAVENOUS | Status: DC

## 2018-06-13 MED ORDER — ACETAMINOPHEN 325 MG PO TABS: 650.0000 mg | ORAL_TABLET | ORAL | Status: DC | PRN

## 2018-06-13 MED ORDER — DIPHENHYDRAMINE HCL 50 MG/ML IJ SOLN: 12.5000 mg | INTRAMUSCULAR | Status: DC | PRN

## 2018-06-13 MED ORDER — FENTANYL 2.5 MCG/ML BUPIVACAINE 1/10 % EPIDURAL INFUSION (WH - ANES)
14.0000 mL/h | INTRAMUSCULAR | Status: DC | PRN
  Administered 2018-06-13 – 2018-06-14 (×2): 14 mL/h via EPIDURAL
  Filled 2018-06-13 (×2): qty 100

## 2018-06-13 MED ORDER — LACTATED RINGERS IV SOLN
INTRAVENOUS | Status: DC
  Administered 2018-06-13 – 2018-06-14 (×2): via INTRAVENOUS

## 2018-06-13 MED ORDER — MISOPROSTOL 50MCG HALF TABLET
50.0000 ug | ORAL_TABLET | ORAL | Status: DC
  Administered 2018-06-13: 50 ug via ORAL
  Filled 2018-06-13 (×8): qty 1

## 2018-06-13 MED ORDER — LIDOCAINE HCL (PF) 1 % IJ SOLN
INTRAMUSCULAR | Status: DC | PRN
  Administered 2018-06-13: 5 mL via EPIDURAL

## 2018-06-13 MED ORDER — OXYCODONE-ACETAMINOPHEN 5-325 MG PO TABS: 2.0000 | ORAL_TABLET | ORAL | Status: DC | PRN

## 2018-06-13 MED ORDER — OXYCODONE-ACETAMINOPHEN 5-325 MG PO TABS: 1.0000 | ORAL_TABLET | ORAL | Status: DC | PRN

## 2018-06-13 MED ORDER — ONDANSETRON HCL 4 MG/2ML IJ SOLN
4.0000 mg | Freq: Four times a day (QID) | INTRAMUSCULAR | Status: DC | PRN
  Administered 2018-06-13 (×2): 4 mg via INTRAVENOUS
  Filled 2018-06-13 (×2): qty 2

## 2018-06-13 MED ORDER — OXYTOCIN 40 UNITS IN LACTATED RINGERS INFUSION - SIMPLE MED
2.5000 [IU]/h | INTRAVENOUS | Status: DC
  Administered 2018-06-14: 2.5 [IU]/h via INTRAVENOUS
  Filled 2018-06-13: qty 1000

## 2018-06-13 NOTE — MAU Note (Signed)
CTX since 2100, now every 2-3 minutes.  ? LOF.  No VB.  + FM.

## 2018-06-13 NOTE — Progress Notes (Signed)
Vitals:   06/13/18 2031 06/13/18 2101  BP: 113/66 118/73  Pulse: 66 81  Resp: 18 18  Temp: 98.2 F (36.8 C)   SpO2:     cx 5/80/-2,  FHR Cat 1.  Ctx q 2-3 minutes, laboring off cytotec.  IUPC placed. Continue present mgt.

## 2018-06-13 NOTE — Progress Notes (Addendum)
Labor Progress Note Christine Macdonald is a 22 y.o. G1P0 at [redacted]w[redacted]d presented for SROM.   S: Pt resting comfortably. Reports some pain associated with FB catheter. Wants to try Tylenol for current pain as she states that "other meds make me nauseous".  O:  BP 110/66   Pulse 61   Temp 97.9 F (36.6 C) (Oral)   Resp 20   SpO2 100%  EFM: 125 bpm/moderate variability/accels 15x15  decels absent  CVE: Dilation: 1 Effacement (%): Thick Cervical Position: Anterior Station: -3 Presentation: Vertex Exam by:: Ma Hillock, CNM   A&P: 22 y.o. G1P0 [redacted]w[redacted]d for SROM. #Labor: Progressing well with FB and oral Cytotec. Continue Cytotec Q4H and monitor.  #Pain: Give acetaminophen 650 mg tablet. Continue IV Fentanyl 100 mcg Q1H PRN with IV Zofran Q6H PRN. #FWB: Cat 1 #GBS negative   Darlean Warmoth, Student-PA 1:45 PM

## 2018-06-13 NOTE — Progress Notes (Signed)
Labor Progress Note Christine Macdonald is a 22 y.o. G1P0 at [redacted]w[redacted]d presented for SROM  S:  Patient requesting IV pain medication. Resting in bed.  O:  BP 110/64   Pulse 95   Temp 98.3 F (36.8 C) (Oral)   Resp 18   SpO2 100%   Fetal Tracing:  Baseline: 140 Variability: moderate Accels: 15x15 Decels: none  Toco: 2-6  CVE: Dilation: 1 Effacement (%): Thick Cervical Position: Anterior Station: -3 Presentation: Vertex Exam by:: Ma Hillock, CNM   A&P: 22 y.o. G1P0 [redacted]w[redacted]d SROM #Labor: No cervical change. Discussed with patient placement of foley balloon for augmentation of labor. Patient agreeable to plan of care. Foley placed and inflated with 60cc sterile water without difficulty. Will give oral cytotec with FB.  #Pain: IV pain medication #FWB: Cat 1 #GBS negative  Rolm Bookbinder, CNM 10:07 AM

## 2018-06-13 NOTE — Anesthesia Pain Management Evaluation Note (Signed)
  CRNA Pain Management Visit Note  Patient: Christine Macdonald, 22 y.o., female  "Hello I am a member of the anesthesia team at [facility]. We have an anesthesia team available at all times to provide care throughout the hospital, including epidural management and anesthesia for C-section. I don't know your plan for the delivery whether it a natural birth, water birth, IV sedation, nitrous supplementation, doula or epidural, but we want to meet your pain goals."   1.Was your pain managed to your expectations on prior hospitalizations?   Yes   2.What is your expectation for pain management during this hospitalization?     Epidural  3.How can we help you reach that goal? Education and epidural  Record the patient's initial score and the patient's pain goal.   Pain: 8/10  Pain Goal: 0/10 [facility] wants you to be able to say your pain was always managed very well.  Salome Arnt 06/13/2018

## 2018-06-13 NOTE — Anesthesia Preprocedure Evaluation (Signed)
Anesthesia Evaluation  Patient identified by MRN, date of birth, ID band Patient awake    Reviewed: Allergy & Precautions, NPO status , Patient's Chart, lab work & pertinent test results  Airway Mallampati: II  TM Distance: >3 FB Neck ROM: Full    Dental no notable dental hx. (+) Teeth Intact   Pulmonary neg pulmonary ROS,    Pulmonary exam normal breath sounds clear to auscultation       Cardiovascular Exercise Tolerance: Good negative cardio ROS Normal cardiovascular exam Rhythm:Regular Rate:Normal     Neuro/Psych negative neurological ROS  negative psych ROS   GI/Hepatic   Endo/Other    Renal/GU      Musculoskeletal   Abdominal   Peds  Hematology   Anesthesia Other Findings   Reproductive/Obstetrics (+) Pregnancy                             Lab Results  Component Value Date   WBC 11.7 (H) 06/13/2018   HGB 10.6 (L) 06/13/2018   HCT 32.1 (L) 06/13/2018   MCV 82.1 06/13/2018   PLT 228 06/13/2018    Anesthesia Physical Anesthesia Plan  ASA: II  Anesthesia Plan: Epidural   Post-op Pain Management:    Induction:   PONV Risk Score and Plan:   Airway Management Planned:   Additional Equipment:   Intra-op Plan:   Post-operative Plan:   Informed Consent: I have reviewed the patients History and Physical, chart, labs and discussed the procedure including the risks, benefits and alternatives for the proposed anesthesia with the patient or authorized representative who has indicated his/her understanding and acceptance.     Plan Discussed with:   Anesthesia Plan Comments:         Anesthesia Quick Evaluation

## 2018-06-13 NOTE — H&P (Signed)
LABOR AND DELIVERY ADMISSION HISTORY AND PHYSICAL NOTE  Christine Macdonald is a 22 y.o. female G1P0 with IUP at [redacted]w[redacted]d by 18 wk sono per maternal report presenting for SOL and concern for PROM. Fern test + in MAU. She reports that she had an episode of fluid leakage at 2am on 9/3. She is unsure of color. Reports that she has not had any leaking since. No evidence of AROM at clinic visit; advised to go to MAU if symptoms worsen or has another episode.  Presents now with ctx since 2100 on 9/4, now every 2-3 minutes.   She reports positive fetal movement. She denies or vaginal bleeding.  Prenatal History/Complications: PNC at [facility] (estbalished at 35w)  Pregnancy complications:  - Limited PNC (established PNC in [place], subsequently moved to [place]--no [facility] from April-July 31)  -elevated BP without diagnosis of HTN  -constipation    Past Medical History: Past Medical History:  Diagnosis Date  . Medical history non-contributory     Past Surgical History: Past Surgical History:  Procedure Laterality Date  . NO PAST SURGERIES      Obstetrical History: OB History    Gravida  1   Para      Term      Preterm      AB      Living        SAB      TAB      Ectopic      Multiple      Live Births              Social History: Social History   Socioeconomic History  . Marital status: Single    Spouse name: Not on file  . Number of children: Not on file  . Years of education: Not on file  . Highest education level: Not on file  Occupational History  . Not on file  Social Needs  . Financial resource strain: Not on file  . Food insecurity:    Worry: Not on file    Inability: Not on file  . Transportation needs:    Medical: Not on file    Non-medical: Not on file  Tobacco Use  . Smoking status: Never Smoker  . Smokeless tobacco: Never Used  Substance and Sexual Activity  . Alcohol use: Not Currently  . Drug use: Never  . Sexual activity: Yes  Lifestyle   . Physical activity:    Days per week: Not on file    Minutes per session: Not on file  . Stress: Not on file  Relationships  . Social connections:    Talks on phone: Not on file    Gets together: Not on file    Attends religious service: Not on file    Active member of club or organization: Not on file    Attends meetings of clubs or organizations: Not on file    Relationship status: Not on file  Other Topics Concern  . Not on file  Social History Narrative  . Not on file    Family History: No family history on file.  Allergies: Allergies  Allergen Reactions  . Quetiapine Fumarate Hives    Medications Prior to Admission  Medication Sig Dispense Refill Last Dose  . acetaminophen (TYLENOL) 325 MG tablet Take 650 mg by mouth every 6 (six) hours as needed.   Taking  . PRENATAL 27-1 MG TABS Take 1 tablet by mouth daily.   Taking     Review of  Systems  All systems reviewed and negative except as stated in HPI  Physical Exam There were no vitals taken for this visit. General appearance: alert, oriented, NAD Lungs: normal respiratory effort Heart: regular rate Abdomen: soft, non-tender; gravid, FH appropriate for GA Extremities: No calf swelling or tenderness Presentation: cephalic by bedside sono  Fetal monitoring: 135 bpm, mod variability, +acels, no decels  Uterine activity: q2-3 min Dilation: 1 Effacement (%): Thick Station: -3 Exam by:: Latricia Heft, RN  Prenatal labs: ABO, Rh: O/Positive/-- (08/13 1004) Antibody: Negative (08/13 1004) Rubella: 3.10 (08/13 1004) RPR: Non Reactive (08/13 1004)  HBsAg: Negative (08/13 1004)  HIV: Non Reactive (08/13 1004)  GC/Chlamydia: Negative  GBS:   Unknown  2-hr GTT: unable to tolerate test d/t vomiting, A1c 5.1  Genetic screening:  Too late  Anatomy US: Normal but limited due to GA   Prenatal Transfer Tool  Maternal Diabetes: No Genetic Screening: Normal Maternal Ultrasounds/Referrals: Normal Fetal Ultrasounds or  other Referrals:  None Maternal Substance Abuse:  No Significant Maternal Medications:  None Significant Maternal Lab Results: None  Results for orders placed or performed during the hospital encounter of 06/13/18 (from the past 24 hour(s))  Fern Test   Collection Time: 06/13/18  3:57 AM  Result Value Ref Range   POCT Fern Test Positive = ruptured amniotic membanes (A)     Patient Active Problem List   Diagnosis Date Noted  . Late prenatal care 05/28/2018  . False labor 05/28/2018  . Supervision of normal first pregnancy, antepartum 05/21/2018  . Elevated BP without diagnosis of hypertension 05/21/2018  . Constipation 05/21/2018    Assessment: Christine Macdonald is a 22 y.o. G1P0 at [redacted]w[redacted]d here for ROM and SOL.   #Labor: With adequate ctx pattern at admission. Will re-evaluate and start augmentation if no cervical change. Concern for PROM given report of leaking of fluid two days prior with +fern test today; although no evidence of ROM on clinical exam 9/3. Mother is afebrile. No fetal or maternal tachycardia present.  #Pain: Epidural upon request  #FWB: Cat I  #ID:  GBS status unknown. Given chance of prolonged ROM, will start prophylaxis with PCN. D/c if PCR is negative.  #MOF: Breast  #MOC: Depo   De Hollingshead 06/13/2018, 4:12 AM

## 2018-06-13 NOTE — Anesthesia Procedure Notes (Signed)
Epidural Patient location during procedure: OB Start time: 06/13/2018 7:09 PM End time: 06/13/2018 7:24 PM  Staffing Anesthesiologist: Trevor Iha, MD Performed: anesthesiologist   Preanesthetic Checklist Completed: patient identified, site marked, surgical consent, pre-op evaluation, timeout performed, IV checked, risks and benefits discussed and monitors and equipment checked  Epidural Patient position: sitting Prep: site prepped and draped and DuraPrep Patient monitoring: continuous pulse ox and blood pressure Approach: midline Location: L2-L3 Injection technique: LOR air  Needle:  Needle type: Tuohy  Needle gauge: 17 G Needle length: 9 cm and 9 Needle insertion depth: 6 cm Catheter type: closed end flexible Catheter size: 19 Gauge Catheter at skin depth: 11 cm Test dose: negative  Assessment Events: blood not aspirated, injection not painful, no injection resistance, negative IV test and no paresthesia  Additional Notes 1 attempt . Pt tolerated procedure well.

## 2018-06-14 ENCOUNTER — Encounter (HOSPITAL_COMMUNITY): Payer: Self-pay | Admitting: *Deleted

## 2018-06-14 ENCOUNTER — Other Ambulatory Visit: Payer: Self-pay

## 2018-06-14 DIAGNOSIS — Z3A38 38 weeks gestation of pregnancy: Secondary | ICD-10-CM

## 2018-06-14 LAB — CULTURE, BETA STREP (GROUP B ONLY): Strep Gp B Culture: NEGATIVE

## 2018-06-14 MED ORDER — IBUPROFEN 600 MG PO TABS
600.0000 mg | ORAL_TABLET | Freq: Four times a day (QID) | ORAL | Status: DC
  Administered 2018-06-14 – 2018-06-15 (×3): 600 mg via ORAL
  Filled 2018-06-14 (×4): qty 1

## 2018-06-14 MED ORDER — COCONUT OIL OIL: 1.0000 "application " | TOPICAL_OIL | Status: DC | PRN

## 2018-06-14 MED ORDER — DIBUCAINE 1 % RE OINT: 1.0000 "application " | TOPICAL_OINTMENT | RECTAL | Status: DC | PRN

## 2018-06-14 MED ORDER — SENNOSIDES-DOCUSATE SODIUM 8.6-50 MG PO TABS
2.0000 | ORAL_TABLET | ORAL | Status: DC
  Administered 2018-06-14: 2 via ORAL
  Filled 2018-06-14: qty 2

## 2018-06-14 MED ORDER — TETANUS-DIPHTH-ACELL PERTUSSIS 5-2.5-18.5 LF-MCG/0.5 IM SUSP: 0.5000 mL | Freq: Once | INTRAMUSCULAR | Status: DC

## 2018-06-14 MED ORDER — LACTATED RINGERS AMNIOINFUSION
INTRAVENOUS | Status: DC
  Filled 2018-06-14 (×2): qty 1000

## 2018-06-14 MED ORDER — ONDANSETRON HCL 4 MG PO TABS: 4.0000 mg | ORAL_TABLET | ORAL | Status: DC | PRN

## 2018-06-14 MED ORDER — INFLUENZA VAC SPLIT QUAD 0.5 ML IM SUSY
0.5000 mL | PREFILLED_SYRINGE | INTRAMUSCULAR | Status: DC
  Filled 2018-06-14: qty 0.5

## 2018-06-14 MED ORDER — ONDANSETRON HCL 4 MG/2ML IJ SOLN: 4.0000 mg | INTRAMUSCULAR | Status: DC | PRN

## 2018-06-14 MED ORDER — ACETAMINOPHEN 325 MG PO TABS: 650.0000 mg | ORAL_TABLET | ORAL | Status: DC | PRN

## 2018-06-14 MED ORDER — ZOLPIDEM TARTRATE 5 MG PO TABS: 5.0000 mg | ORAL_TABLET | Freq: Every evening | ORAL | Status: DC | PRN

## 2018-06-14 MED ORDER — BENZOCAINE-MENTHOL 20-0.5 % EX AERO
1.0000 "application " | INHALATION_SPRAY | CUTANEOUS | Status: DC | PRN
  Administered 2018-06-14: 1 via TOPICAL
  Filled 2018-06-14: qty 56

## 2018-06-14 MED ORDER — PRENATAL MULTIVITAMIN CH
1.0000 | ORAL_TABLET | Freq: Every day | ORAL | Status: DC
  Filled 2018-06-14: qty 1

## 2018-06-14 MED ORDER — SIMETHICONE 80 MG PO CHEW: 80.0000 mg | CHEWABLE_TABLET | ORAL | Status: DC | PRN

## 2018-06-14 MED ORDER — WITCH HAZEL-GLYCERIN EX PADS: 1.0000 "application " | MEDICATED_PAD | CUTANEOUS | Status: DC | PRN

## 2018-06-14 MED ORDER — DIPHENHYDRAMINE HCL 25 MG PO CAPS: 25.0000 mg | ORAL_CAPSULE | Freq: Four times a day (QID) | ORAL | Status: DC | PRN

## 2018-06-14 MED ORDER — TERBUTALINE SULFATE 1 MG/ML IJ SOLN
INTRAMUSCULAR | Status: AC
  Administered 2018-06-14: 1 mg
  Filled 2018-06-14: qty 1

## 2018-06-14 NOTE — Progress Notes (Addendum)
OB/GYN Faculty Practice: Labor Progress Note Christine Macdonald is a 22 y.o. G1P0 at [redacted]w[redacted]d presented for SROM.   Subjective: Patient doing well. Feeling pressure in her pelvis and back during contractions   Objective: BP 117/62   Pulse 76   Temp 98.5 F (36.9 C) (Oral)   Resp 18   SpO2 100%  Gen: alert, NAD, resting comfortably  Dilation: 5.5 Effacement (%): 80 Cervical Position: Anterior Station: -2 Presentation: Vertex Exam by:: Hermenia Bers, RN  FHT: baseline 130; moderate variability. No accelerations or decelerations   Assessment and Plan: 22 y.o. G1P0 [redacted]w[redacted]d presenting for SROM.   Labor: progressing well  -- pain control: epidural  -- Pitocin 2x2   Fetal Well-Being Cephalic by sutures .  -- Category 1 - continuous fetal monitoring  -- GBS (negative)   John Derrel Nip MS4 [facility]  2:45 AM

## 2018-06-14 NOTE — Progress Notes (Signed)
Vitals:   06/14/18 0631 06/14/18 0701  BP: 111/64 115/70  Pulse: 72 71  Resp: 18 18  Temp: 98.6 F (37 C)   SpO2:     Baby began having late decels, pitocin was dc/d . Decels did not resolve, so 0.25mg  terbutaline given sq.  FHR now cat 1 w/o decels.  Not much change over the last few hours, baby has descended some per RN. WIll allow Cat 1 for a little while then restart pitocin.

## 2018-06-14 NOTE — Lactation Note (Signed)
This note was copied from a baby's chart. Lactation Consultation Note  Patient Name: Christine Macdonald JIRCV'E Date: 06/14/2018 Reason for consult: Initial assessment;1st time breastfeeding;Primapara;Term;Infant < 6lbs;Other (Comment)(SGA)  5 hours old FT < 6 lbs SGA female who is being exclusively BF by her mother, she's a P1. Mom had late and limited PNC and took no BF classes. LC showed mom how to hand express, but she complained of pain in her nipples when assisting with hand expression, LC stopped. Small droplets of colostrum were seen though, also noted that mom has flat nipples. She doesn't have a pump at home, [facility] offered a hand pump from the hospital to take home.   LC also asked mom how does she feel about double pumping early on while at the hospital since baby most likely will get supplemented, our first choice will always be mother's milk. Mom agreed to start pumping today. She didn't bring a nursing bra to the hospital, asked dad to bring one tomorrow so she can start wearing her shells; he said he will.  Set her up with breast shells, a hand pump and a DEBP. Instructions, cleaning and storage were reviewed, as well as milk storage guidelines. Mom will be pumping every 3 hours and at least once at night. Showed mom how to finger feed baby; discussed the benefits of colostrum for a small baby. Offered assistance with latch and mom agreed to wake baby up. LC took baby STS in football position to the right breast but she wouldn't latch, LC did suck training and noticed that baby wouldn't suck either, just a tight grip noted, baby fell asleep at the breast a few seconds later. An attempt was documented in Flowsheets. Asked mom to call for assistance whenever is needed.  Encouraged mom to feed baby STS every 3 hours and to wake her up to feed if she's not cueing. Mom will pump after every feeding and will spoon or finger feed baby any amount of EBM that she may get. Mom is also aware that if  amount of EBM is not sufficient according to baby's age, baby may have to be supplemented due to her birth weight and the formula of choice will be Similac 22 calories. BF brochure, BF resources and feeding diary were reviewed, mom is aware of LC services and will call PRN.  Maternal Data Formula Feeding for Exclusion: No Has patient been taught Hand Expression?: Yes Does the patient have breastfeeding experience prior to this delivery?: No  Feeding Feeding Type: Breast Fed Length of feed: (hand expressed few drops to mouth)  LATCH Score Latch: Too sleepy or reluctant, no latch achieved, no sucking elicited.  Audible Swallowing: None  Type of Nipple: Everted at rest and after stimulation  Comfort (Breast/Nipple): Soft / non-tender  Hold (Positioning): Assistance needed to correctly position infant at breast and maintain latch.  LATCH Score: 5  Interventions Interventions: Breast feeding basics reviewed;Assisted with latch;Skin to skin;Breast massage;Hand express;Pre-pump if needed;Breast compression;Adjust position;Reverse pressure;DEBP;Hand pump;Shells;Support pillows  Lactation Tools Discussed/Used Tools: Pump;Shells Shell Type: Inverted Breast pump type: Double-Electric Breast Pump;Manual WIC Program: No(but will apply here at the hospital) Pump Review: Setup, frequency, and cleaning;Milk Storage Initiated by:: MPeck Date initiated:: 06/14/18   Consult Status Consult Status: Follow-up Date: 06/15/18 Follow-up type: In-patient    Phil Michels [name] 06/14/2018, 4:45 PM

## 2018-06-14 NOTE — Anesthesia Postprocedure Evaluation (Signed)
Anesthesia Post Note  Patient: Christine Macdonald  Procedure(s) Performed: AN AD HOC LABOR EPIDURAL     Patient location during evaluation: Mother Baby Anesthesia Type: Epidural Level of consciousness: awake and alert Pain management: pain level controlled Vital Signs Assessment: post-procedure vital signs reviewed and stable Respiratory status: spontaneous breathing and respiratory function stable Cardiovascular status: stable Postop Assessment: no headache, no backache, patient able to bend at knees, able to ambulate and no apparent nausea or vomiting    Last Vitals:  Vitals:   06/14/18 1345 06/14/18 1445  BP: 127/90 120/67  Pulse: 90 87  Resp: 17 17  Temp: 36.8 C 36.9 C  SpO2: 100% 100%    Last Pain:  Vitals:   06/14/18 1507  TempSrc:   PainSc: 0-No pain   Pain Goal: Patients Stated Pain Goal: 2 (06/13/18 1957)               Cephus Shelling

## 2018-06-15 MED ORDER — IBUPROFEN 600 MG PO TABS: 600.0000 mg | ORAL_TABLET | Freq: Four times a day (QID) | ORAL | 0 refills | Status: DC | PRN

## 2018-06-15 MED ORDER — PRENATAL MULTIVITAMIN CH: ORAL_TABLET | ORAL | 6 refills | Status: DC

## 2018-06-15 MED ORDER — POLYETHYLENE GLYCOL 3350 17 G PO PACK: PACK | ORAL | 0 refills | Status: DC

## 2018-06-15 NOTE — Discharge Summary (Signed)
Obstetrics Discharge Summary OB/GYN Faculty Practice   Patient Name: Christine Macdonald DOB: [birth year] MRN: [id number]  Date of admission: 06/13/2018 Delivering MD: Mirian Mo   Date of discharge: 06/15/2018  Admitting diagnosis: 38.6WKS CTX Intrauterine pregnancy: [redacted]w[redacted]d     Secondary diagnosis:   Active Problems:   Premature rupture of membranes  Additional problems:  . Late transfer of prenatal care from [place] . Limited prenatal care . Elevated BP without diagnosis of HTN     Discharge diagnosis: Term Pregnancy Delivered                                            Postpartum procedures: None  Complications: none  Hospital course: Christine Macdonald is a 22 y.o. [redacted]w[redacted]d G1P1001 who was admitted for ROM. Her pregnancy was complicated by the above noted items. Her labor course was notable for ROM and spontaneous onset of labor. Augmentation pursued with foley bulb and cytotec and subsequently pitocin. Delivery was complicated by 1st degree laceration. Please see delivery/op note for additional details. Her postpartum course was uncomplicated. She was breastfeeding without difficulty and hoping to set-up [facility] services and get a breast pump. By day of discharge, she was passing flatus, urinating, eating and drinking without difficulty. Her pain was well-controlled, and she was discharged home with ibuprofen. She will follow-up in clinic in 4-6 weeks. She plans on using depo for birth control.    Physical exam  Vitals:   06/14/18 2230 06/15/18 0230 06/15/18 0547 06/15/18 0810  BP: 135/78 108/79 110/78   Pulse: 86 72 76   Resp: 18 18 18    Temp: 98.2 F (36.8 C) 97.8 F (36.6 C) 98 F (36.7 C)   TempSrc: Oral Oral Tympanic   SpO2: 99%     Weight:    71.9 kg  Height:    5\' 2"  (1.575 m)   General: well-appearing, NAD, sitting up in bed Lochia: appropriate Uterine Fundus: firm Incision: 1st degree, not examined DVT Evaluation: No evidence of DVT seen on physical exam. Trace edema to  ankles bilaterally.   Labs: Lab Results  Component Value Date   WBC 11.7 (H) 06/13/2018   HGB 10.6 (L) 06/13/2018   HCT 32.1 (L) 06/13/2018   MCV 82.1 06/13/2018   PLT 228 06/13/2018   CMP Latest Ref Rng & Units 05/21/2018  Glucose 65 - 99 mg/dL 009(Q)  BUN 6 - 20 mg/dL 3(L)  Creatinine 3.30 - 1.00 mg/dL 0.76(A)  Sodium 263 - 335 mmol/L 137  Potassium 3.5 - 5.2 mmol/L 3.6  Chloride 96 - 106 mmol/L 103  CO2 20 - 29 mmol/L 19(L)  Calcium 8.7 - 10.2 mg/dL 9.1  Total Protein 6.0 - 8.5 g/dL 6.5  Total Bilirubin 0.0 - 1.2 mg/dL 0.2  Alkaline Phos 39 - 117 IU/L 148(H)  AST 0 - 40 IU/L 12  ALT 0 - 32 IU/L 7    Discharge instructions: Per After Visit Summary and "Baby and Me Booklet"  After visit meds:  Allergies as of 06/15/2018   No Known Allergies     Medication List    TAKE these medications   acetaminophen 325 MG tablet Commonly known as:  TYLENOL Take 650 mg by mouth every 6 (six) hours as needed.   ibuprofen 600 MG tablet Commonly known as:  ADVIL,MOTRIN Take 1 tablet (600 mg total) by mouth every 6 (six) hours as  needed for moderate pain or cramping.   polyethylene glycol packet Commonly known as:  MIRALAX / GLYCOLAX Mix one packet in beverage of your choice and drink nightly for softer stools.   prenatal multivitamin Tabs tablet Take 1 tablet daily.       Postpartum contraception: Depo Provera Diet: Routine Diet Activity: Advance as tolerated. Pelvic rest for 6 weeks.   Outpatient follow up:4-6 weeks Follow-up Appt: Future Appointments  Date Time Provider Department Center  07/23/2018 10:55 AM Leftwich-Kirby, Wilmer Floor, CNM [facility]   Newborn Data: Live born female  Birth Weight: 5 lb 8.9 oz (2520 g) APGAR: 8, 9  Newborn Delivery   Birth date/time:  06/14/2018 10:54:00 Delivery type:  Vaginal, Spontaneous    Baby Feeding: Breast and Bottle  Disposition:home with mother  Cristal Deer. Earlene Plater, DO OB/GYN Fellow, Faculty Practice

## 2018-06-15 NOTE — Lactation Note (Addendum)
This note was copied from a baby's chart. Lactation Consultation Note  Patient Name: Christine Macdonald EBRAX'[id number] Date: 06/15/2018 Reason for consult: Follow-up assessment;Primapara;1st time breastfeeding;Term;Infant < 6lbs;Other (Comment)(SGA)  26 hours old FT < 6 lbs female who is now being partially BF but mostly formula fed by his mother, she's a P1. Mom has not pumped today yet, and she has not started wearing breast shells that LC got for her yesterday. She has not followed the plan and now she's complaining that baby is not tolerating formula feedings. Explained to mom that a lot of times babies don't tolerate formulas, but that is also important to watch the volumes given and follow the supplementation guidelines according the baby's age. Mom voiced understanding.  RN already assisting with latch when entering the room, baby able to latch for 5 minutes but no swallows were noted, advised mom to do STS on the next feeding, baby is wearing his onesie. Asked mom to call her RN for assistance when needed. Encouraged her to keep putting baby to the breast every 3 hours and to wake him up to feed if baby is not cueing in a 3 hour period. Mom was educated about pacifier use, visitors got her a pack of pacifiers. Mom aware of LC services and will call PRN.   Maternal Data    Feeding Feeding Type: Breast Fed Length of feed: 5 min  LATCH Score Latch: Repeated attempts needed to sustain latch, nipple held in mouth throughout feeding, stimulation needed to elicit sucking reflex.  Audible Swallowing: None(room very loud and full of visitors, but didn't see long extensions of the jaw, baby was mostly suckling)  Type of Nipple: Everted at rest and after stimulation  Comfort (Breast/Nipple): Soft / non-tender  Hold (Positioning): Assistance needed to correctly position infant at breast and maintain latch.  LATCH Score: 6  Interventions Interventions: Breast feeding basics reviewed;Assisted with  latch;Adjust position;Support pillows;Breast compression  Lactation Tools Discussed/Used     Consult Status Consult Status: Follow-up Date: 06/16/18 Follow-up type: In-patient    Christine Macdonald Christine Macdonald 06/15/2018, 1:12 PM

## 2018-06-15 NOTE — Clinical Social Work Maternal (Signed)
CLINICAL SOCIAL WORK MATERNAL/CHILD NOTE  Patient Details  Name: Christine Macdonald MRN: [id number] Date of Birth: 1996-04-02  Date:  06/15/2018  Clinical Social Worker Initiating Note:  Madilyn Fireman, MSW, LCSW-A Date/Time: Initiated:  06/15/18/1445     Child's Name:  Christine Macdonald   Biological Parents:  Mother, Father   Need for Interpreter:  None   Reason for Referral:  Late or No Prenatal Care    Address:  [address] [place] [address]    Phone number:  [phone] (home)     Additional phone number:   Household Members/Support Persons (HM/SP):   Household Member/Support Person 1   HM/SP Name Relationship DOB or Age  HM/SP -Christine Macdonald Boyfriend, FOB    HM/SP -2        HM/SP -3        HM/SP -4        HM/SP -5        HM/SP -6        HM/SP -7        HM/SP -8          Natural Supports (not living in the home):  Extended Family, Immediate Family, Friends   Chiropodist: None   Employment: Unemployed   Type of Work:     Education:  Programmer, systems   Homebound arranged:    Museum/gallery curator Resources:  Medicaid   Other Resources:  Physicist, medical , [place] Considerations Which May Impact Care:  None  Strengths:  Ability to meet basic needs , Engineer, materials, Home prepared for child    Psychotropic Medications:         Pediatrician:    Recruitment consultant Family Medicine)  Pediatrician List:   [place]      Pediatrician Fax Number:    Risk Factors/Current Problems:      Cognitive State:  Alert , Able to Concentrate    Mood/Affect:  Calm , Comfortable , Bright    CSW Assessment: CSW received consult for MOB due to limited prenatal care due to relocation mid-pregnancy from [place]. CSW met with MOB and baby Christine Macdonald at bedside to complete assessment. MOB reports that she is currently  living at mother in [place] residence with her spouse Christine Macdonald. MOB reports that she is unemployed, receives [employer], [employer], and Physicist, medical. MOB reports that her limited prenatal care was because her and Christine Macdonald moved to [place] halfway through and her Medicaid took a while to get changed over. MOB reports having taken some college classes. MOB reports she has a new car seat for safe transportation of infant with knowledge of installation and use. MOB reports Christine Macdonald will sleep in a pack and play. SIDS precautions thoroughly reviewed. MOB reports Christine Macdonald will receive pediatric care at [facility]. CSW discussed hospital drug screening policies with MOB, she did not have questions or concerns. MOB denies any substance use during pregnancy. MOB reports having all items at home to care for newborn. MOB was encouraged to reach out for assistance if questions or concerns arise.  CSW will continue to monitor chart for results and will make report if warranted. MOB's UDS was negative.  CSW Plan/Description:  No Further Intervention Required/No Barriers to Discharge, CSW Will Continue to Monitor Umbilical Cord Tissue Drug Screen Results and Make Report if Warranted  Christine Macdonald, [place] 06/15/2018, 2:47 PM

## 2018-06-16 ENCOUNTER — Ambulatory Visit: Payer: Self-pay

## 2018-06-16 NOTE — Lactation Note (Signed)
This note was copied from a baby's chart. Lactation Consultation Note  Patient Name: Christine Macdonald [id number] Date: 06/16/2018 Reason for consult: Follow-up assessment;1st time breastfeeding;Primapara;Term;Infant < 6lbs  Visited with P1 Mom of a SGA baby at term, at 3% weight loss at 28 hrs old.  Baby has been fed mostly bottles, with occasional breastfeedings for 5-20 mins.  No double pumping done, pump set up at bedside.  Mom wanting to given formula before milk volume increases. Explained need for breast stimulation and baby learning to latch and feed well.  On day of discharge, baby in crib, noted to be cueing.  Offered to assist with latching to breast.  Mom needing much guidance on use of cross cradle hold to best control latch.  Mom latching baby to nipple and he will suck and come off.  Assisted with use of cross cradle hold, and demonstrated how to wait for baby to open widely.  Baby unable to attain a deep latch. Breast massage and hand expression reviewed.  Colostrum expressed easily.  Oral assessment show a suspected short anterior lingual frenulum and tongue U shaped on elevation.  Initiated a 20 mm nipple shield, with instructions on cleaning and how to properly apply.  Nipple pulled well into shield.  Baby still having difficulty maintaining a latch, popping on and off.   Took nipple shield off, and baby able to attain a deep latch to breast.  Mom denies feeling any discomfort.  Swallows identified for Mom.  Mom taught to use alternate breast compression.  Encouraged feeding baby on both breasts, sandwiching breast to facilitate baby being able to attain a deeper latch, thus transferring adequate milk.  Plan- 1- Keep baby STS as much as possible and latch baby to breast when she cues, or at 3 hrs. 2- If baby obtains formula, recommend pumping to support her milk supply 3- engorgement prevention and treatment reviewed. 4- DEBP rental available in gift shop, Mom has a hand  pump.  Does not have WIC 5- Recommend follow-up with OP lactation this week.    Request sent to clinic for OP Lactation appointment to assess baby's latch, milk transfer etc.  Mom aware of OP lactation support available to her.  Encouraged to call prn.    Consult Status Consult Status: Complete Date: 06/16/18 Follow-up type: Out-patient    Judee Clara 06/16/2018, 9:53 AM

## 2018-06-18 ENCOUNTER — Encounter: Payer: Self-pay | Admitting: Student

## 2018-06-25 ENCOUNTER — Encounter: Payer: Self-pay | Admitting: Student

## 2018-06-28 ENCOUNTER — Encounter: Payer: Self-pay | Admitting: *Deleted

## 2018-07-23 ENCOUNTER — Other Ambulatory Visit (HOSPITAL_COMMUNITY)
Admission: RE | Admit: 2018-07-23 | Discharge: 2018-07-23 | Disposition: A | Discharge status: Home / Self Care | Payer: Medicaid Other | Source: Ambulatory Visit | Attending: Advanced Practice Midwife | Admitting: Advanced Practice Midwife

## 2018-07-23 ENCOUNTER — Encounter: Payer: Self-pay | Admitting: Advanced Practice Midwife

## 2018-07-23 ENCOUNTER — Ambulatory Visit (INDEPENDENT_AMBULATORY_CARE_PROVIDER_SITE_OTHER): Payer: Medicaid Other | Admitting: Advanced Practice Midwife

## 2018-07-23 DIAGNOSIS — Z3042 Encounter for surveillance of injectable contraceptive: Secondary | ICD-10-CM | POA: Diagnosis present

## 2018-07-23 DIAGNOSIS — Z30013 Encounter for initial prescription of injectable contraceptive: Secondary | ICD-10-CM

## 2018-07-23 DIAGNOSIS — Z3009 Encounter for other general counseling and advice on contraception: Secondary | ICD-10-CM | POA: Diagnosis not present

## 2018-07-23 DIAGNOSIS — Z1389 Encounter for screening for other disorder: Secondary | ICD-10-CM

## 2018-07-23 LAB — POCT PREGNANCY, URINE: PREG TEST UR: NEGATIVE

## 2018-07-23 MED ORDER — MEDROXYPROGESTERONE ACETATE 150 MG/ML IM SUSP
150.0000 mg | Freq: Once | INTRAMUSCULAR | Status: AC
  Administered 2018-07-23: 150 mg via INTRAMUSCULAR

## 2018-07-23 NOTE — Progress Notes (Signed)
Pt left before seeing integrated behavioral health.  Pt had been instructed to wait and was waiting less than 10 minutes before leaving.

## 2018-07-23 NOTE — Progress Notes (Signed)
Subjective:     Christine Macdonald is a 22 y.o. female who presents for a postpartum visit. She is 5 weeks postpartum following a spontaneous vaginal delivery. I have fully reviewed the prenatal and intrapartum course. The delivery was at [redacted]w[redacted]d gestational weeks. Outcome: spontaneous vaginal delivery. Anesthesia: epidural. Postpartum course has been uncomplicated. Baby's course has been unremarkable. Baby is feeding by both breast and bottle - Similac Neosure. Bleeding no bleeding. Bowel function is normal. Bladder function is normal. Patient is sexually active. Contraception method is Depo-Provera injections. Postpartum depression screening: positive.  The following portions of the patient's history were reviewed and updated as appropriate: allergies, current medications, past family history, past medical history, past social history, past surgical history and problem list.  Review of Systems Pertinent items are noted in HPI.   Objective:    BP 133/88   Pulse 78   Ht 5\' 2"  (1.575 m)   Wt 147 lb 14.4 oz (67.1 kg)   Breastfeeding? Yes   BMI 27.05 kg/m   Physical Exam  Constitutional: She is oriented to person, place, and time. She appears well-developed and well-nourished. No distress.  HENT:  Head: Normocephalic.  Cardiovascular: Normal rate.  Pulmonary/Chest: Effort normal.  Abdominal: Soft. There is no tenderness.  Genitourinary:  Genitourinary Comments:  External: no lesion Vagina: small amount of white discharge Cervix: pink, smooth, no CMT, pap collected  Uterus: NSSC Adnexa: NT   Neurological: She is alert and oriented to person, place, and time.  Skin: Skin is warm and dry.  Psychiatric: She has a normal mood and affect.  Nursing note and vitals reviewed.  Results for orders placed or performed in visit on 07/23/18 (from the past 24 hour(s))  Pregnancy, urine POC     Status: None   Collection Time: 07/23/18 11:15 AM  Result Value Ref Range   Preg Test, Ur NEGATIVE  NEGATIVE     Assessment:     Routine postpartum exam. Pap smear done at today's visit.   Plan:    1. Contraception: Depo-Provera injections 2. Routine care 3. Follow up in: 1 Year or as needed.   4. 12 weeks for next depo 5. EDPS 10 today, patient will see Asher Muir

## 2018-07-23 NOTE — Addendum Note (Signed)
Addended by: Osvaldo Human on: 07/23/2018 11:41 AM   Modules accepted: Orders

## 2018-07-23 NOTE — Patient Instructions (Signed)

## 2018-07-30 LAB — CYTOLOGY - PAP
CHLAMYDIA, DNA PROBE: NEGATIVE
Diagnosis: UNDETERMINED — AB
HPV 16/18/45 genotyping: NEGATIVE
HPV: DETECTED — AB
NEISSERIA GONORRHEA: NEGATIVE

## 2018-10-07 ENCOUNTER — Encounter: Payer: Self-pay | Admitting: General Practice

## 2018-10-07 ENCOUNTER — Ambulatory Visit: Payer: Self-pay

## 2018-10-07 NOTE — Progress Notes (Signed)
Patient presented to office for Depo Injection but did not have insurance. Patient was told to contact Medicaid Case Worker by front office staff & was not seen.  Chase Callerarrie H RN BSN 10/07/18

## 2019-08-01 ENCOUNTER — Encounter (HOSPITAL_COMMUNITY): Payer: Self-pay

## 2019-08-01 ENCOUNTER — Other Ambulatory Visit: Payer: Self-pay

## 2019-08-01 ENCOUNTER — Inpatient Hospital Stay (HOSPITAL_COMMUNITY)
Admission: EM | Admit: 2019-08-01 | Discharge: 2019-08-02 | Disposition: A | Discharge status: Other Acute Inpatient Hospital | Payer: Self-pay | Attending: Emergency Medicine | Admitting: Emergency Medicine

## 2019-08-01 DIAGNOSIS — O26899 Other specified pregnancy related conditions, unspecified trimester: Secondary | ICD-10-CM

## 2019-08-01 DIAGNOSIS — Z3A08 8 weeks gestation of pregnancy: Secondary | ICD-10-CM

## 2019-08-01 DIAGNOSIS — Z331 Pregnant state, incidental: Secondary | ICD-10-CM | POA: Insufficient documentation

## 2019-08-01 DIAGNOSIS — T7421XA Adult sexual abuse, confirmed, initial encounter: Secondary | ICD-10-CM

## 2019-08-01 DIAGNOSIS — R102 Pelvic and perineal pain: Secondary | ICD-10-CM | POA: Insufficient documentation

## 2019-08-01 DIAGNOSIS — R1030 Lower abdominal pain, unspecified: Secondary | ICD-10-CM

## 2019-08-01 LAB — URINALYSIS, ROUTINE W REFLEX MICROSCOPIC
Bilirubin Urine: NEGATIVE
Glucose, UA: NEGATIVE mg/dL
Hgb urine dipstick: NEGATIVE
Ketones, ur: 80 mg/dL — AB
Leukocytes,Ua: NEGATIVE
Nitrite: NEGATIVE
Protein, ur: NEGATIVE mg/dL
Specific Gravity, Urine: 1.024 (ref 1.005–1.030)
pH: 6 (ref 5.0–8.0)

## 2019-08-01 LAB — CBC WITH DIFFERENTIAL/PLATELET
Abs Immature Granulocytes: 0.06 10*3/uL (ref 0.00–0.07)
Basophils Absolute: 0 10*3/uL (ref 0.0–0.1)
Basophils Relative: 0 %
Eosinophils Absolute: 0.1 10*3/uL (ref 0.0–0.5)
Eosinophils Relative: 0 %
HCT: 40.3 % (ref 36.0–46.0)
Hemoglobin: 13.8 g/dL (ref 12.0–15.0)
Immature Granulocytes: 1 %
Lymphocytes Relative: 14 %
Lymphs Abs: 1.9 10*3/uL (ref 0.7–4.0)
MCH: 29.1 pg (ref 26.0–34.0)
MCHC: 34.2 g/dL (ref 30.0–36.0)
MCV: 85 fL (ref 80.0–100.0)
Monocytes Absolute: 0.7 10*3/uL (ref 0.1–1.0)
Monocytes Relative: 5 %
Neutro Abs: 10.5 10*3/uL — ABNORMAL HIGH (ref 1.7–7.7)
Neutrophils Relative %: 80 %
Platelets: 284 10*3/uL (ref 150–400)
RBC: 4.74 MIL/uL (ref 3.87–5.11)
RDW: 12.3 % (ref 11.5–15.5)
WBC: 13.2 10*3/uL — ABNORMAL HIGH (ref 4.0–10.5)
nRBC: 0 % (ref 0.0–0.2)

## 2019-08-01 LAB — HCG, QUANTITATIVE, PREGNANCY: hCG, Beta Chain, Quant, S: 145301 m[IU]/mL — ABNORMAL HIGH (ref <5)

## 2019-08-01 LAB — BASIC METABOLIC PANEL
Anion gap: 11 (ref 5–15)
BUN: 5 mg/dL — ABNORMAL LOW (ref 6–20)
CO2: 19 mmol/L — ABNORMAL LOW (ref 22–32)
Calcium: 9.3 mg/dL (ref 8.9–10.3)
Chloride: 105 mmol/L (ref 98–111)
Creatinine, Ser: 0.6 mg/dL (ref 0.44–1.00)
GFR calc Af Amer: >60 mL/min (ref >60)
GFR calc non Af Amer: >60 mL/min (ref >60)
Glucose, Bld: 83 mg/dL (ref 70–99)
Potassium: 3.5 mmol/L (ref 3.5–5.1)
Sodium: 135 mmol/L (ref 135–145)

## 2019-08-01 LAB — I-STAT BETA HCG BLOOD, ED (MC, WL, AP ONLY): I-stat hCG, quantitative: >2000 m[IU]/mL — ABNORMAL HIGH (ref <5)
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
## 2019-08-01 NOTE — Progress Notes (Addendum)
Consult request has been received from [facility] RN CM who was made aware of the pt by the [employer]. CSW attempting to follow up at present time, but pt undergoing a SANE exam.  Per the RN CM, the pt has already been in contact with the [employer] and was directed to come to the [facility] ED for a medical work-up and to speak to [employer].  Per the [facility] RN CM, the pt is in her room, completing the SANE exam and a female police officer is bedside completing a report with the pt now.  TOC RN CM will contact the CSW if anything in addition is needed.  CSW will continue to follow for D/C needs.  Alphonse Guild. Charlot Gouin, LCSW, LCAS, CSI Transitions of Care Clinical Social Worker Care Coordination Department Ph: [phone]      .

## 2019-08-01 NOTE — ED Notes (Signed)
Pt to be transported to MAU rm 25.
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
## 2019-08-02 ENCOUNTER — Inpatient Hospital Stay (HOSPITAL_COMMUNITY): Payer: Self-pay

## 2019-08-02 ENCOUNTER — Encounter (HOSPITAL_COMMUNITY): Payer: Self-pay

## 2019-08-02 DIAGNOSIS — O26891 Other specified pregnancy related conditions, first trimester: Secondary | ICD-10-CM

## 2019-08-02 DIAGNOSIS — Z3A08 8 weeks gestation of pregnancy: Secondary | ICD-10-CM

## 2019-08-02 DIAGNOSIS — O9A211 Injury, poisoning and certain other consequences of external causes complicating pregnancy, first trimester: Secondary | ICD-10-CM

## 2019-08-02 DIAGNOSIS — T7421XA Adult sexual abuse, confirmed, initial encounter: Secondary | ICD-10-CM

## 2019-08-02 DIAGNOSIS — R1030 Lower abdominal pain, unspecified: Secondary | ICD-10-CM

## 2019-08-02 LAB — WET PREP, GENITAL
Sperm: NONE SEEN
Trich, Wet Prep: NONE SEEN
Yeast Wet Prep HPF POC: NONE SEEN

## 2019-08-02 LAB — RPR: RPR Ser Ql: NONREACTIVE

## 2019-08-02 LAB — HIV ANTIBODY (ROUTINE TESTING W REFLEX): HIV Screen 4th Generation wRfx: NONREACTIVE

## 2019-08-02 MED ORDER — METRONIDAZOLE 500 MG PO TABS: 500.0000 mg | ORAL_TABLET | Freq: Two times a day (BID) | ORAL | 0 refills | Status: DC
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
## 2019-08-02 NOTE — ED Notes (Signed)
Patient taken to MAU with SANE RN and ED tech.

## 2019-08-02 NOTE — Discharge Instructions (Signed)
Bacterial Vaginosis  Bacterial vaginosis is a vaginal infection that occurs when the normal balance of bacteria in the vagina is disrupted. It results from an overgrowth of certain bacteria. This is the most common vaginal infection among women ages 15-44. Because bacterial vaginosis increases your risk for STIs (sexually transmitted infections), getting treated can help reduce your risk for chlamydia, gonorrhea, herpes, and HIV (human immunodeficiency virus). Treatment is also important for preventing complications in pregnant women, because this condition can cause an early (premature) delivery. What are the causes? This condition is caused by an increase in harmful bacteria that are normally present in small amounts in the vagina. However, the reason that the condition develops is not fully understood. What increases the risk? The following factors may make you more likely to develop this condition:  Having a new sexual partner or multiple sexual partners.  Having unprotected sex.  Douching.  Having an intrauterine device (IUD).  Smoking.  Drug and alcohol abuse.  Taking certain antibiotic medicines.  Being pregnant. You cannot get bacterial vaginosis from toilet seats, bedding, swimming pools, or contact with objects around you. What are the signs or symptoms? Symptoms of this condition include:  Grey or white vaginal discharge. The discharge can also be watery or foamy.  A fish-like odor with discharge, especially after sexual intercourse or during menstruation.  Itching in and around the vagina.  Burning or pain with urination. Some women with bacterial vaginosis have no signs or symptoms. How is this diagnosed? This condition is diagnosed based on:  Your medical history.  A physical exam of the vagina.  Testing a sample of vaginal fluid under a microscope to look for a large amount of bad bacteria or abnormal cells. Your health care provider may use a cotton swab or  a small wooden spatula to collect the sample. How is this treated? This condition is treated with antibiotics. These may be given as a pill, a vaginal cream, or a medicine that is put into the vagina (suppository). If the condition comes back after treatment, a second round of antibiotics may be needed. Follow these instructions at home: Medicines  Take over-the-counter and prescription medicines only as told by your health care provider.  Take or use your antibiotic as told by your health care provider. Do not stop taking or using the antibiotic even if you start to feel better. General instructions  If you have a female sexual partner, tell her that you have a vaginal infection. She should see her health care provider and be treated if she has symptoms. If you have a female sexual partner, he does not need treatment.  During treatment: ? Avoid sexual activity until you finish treatment. ? Do not douche. ? Avoid alcohol as directed by your health care provider. ? Avoid breastfeeding as directed by your health care provider.  Drink enough water and fluids to keep your urine clear or pale yellow.  Keep the area around your vagina and rectum clean. ? Wash the area daily with warm water. ? Wipe yourself from front to back after using the toilet.  Keep all follow-up visits as told by your health care provider. This is important. How is this prevented?  Do not douche.  Wash the outside of your vagina with warm water only.  Use protection when having sex. This includes latex condoms and dental dams.  Limit how many sexual partners you have. To help prevent bacterial vaginosis, it is best to have sex with just one partner (  monogamous).  Make sure you and your sexual partner are tested for STIs.  Wear cotton or cotton-lined underwear.  Avoid wearing tight pants and pantyhose, especially during summer.  Limit the amount of alcohol that you drink.  Do not use any products that contain  nicotine or tobacco, such as cigarettes and e-cigarettes. If you need help quitting, ask your health care provider.  Do not use illegal drugs. Where to find more information  Centers for Disease Control and Prevention: www.cdc.gov/std  American Sexual Health Association (ASHA): www.ashastd.org  U.S. Department of Health and Human Services, Office on Women's Health: www.womenshealth.gov/ or https://www.womenshealth.gov/a-z-topics/bacterial-vaginosis Contact a health care provider if:  Your symptoms do not improve, even after treatment.  You have more discharge or pain when urinating.  You have a fever.  You have pain in your abdomen.  You have pain during sex.  You have vaginal bleeding between periods. Summary  Bacterial vaginosis is a vaginal infection that occurs when the normal balance of bacteria in the vagina is disrupted.  Because bacterial vaginosis increases your risk for STIs (sexually transmitted infections), getting treated can help reduce your risk for chlamydia, gonorrhea, herpes, and HIV (human immunodeficiency virus). Treatment is also important for preventing complications in pregnant women, because the condition can cause an early (premature) delivery.  This condition is treated with antibiotic medicines. These may be given as a pill, a vaginal cream, or a medicine that is put into the vagina (suppository). This information is not intended to replace advice given to you by your health care provider. Make sure you discuss any questions you have with your health care provider. Document Released: 09/25/2005 Document Revised: 09/07/2017 Document Reviewed: 06/10/2016 Elsevier Patient Education  2020 Elsevier Inc.  

## 2019-08-05 LAB — GC/CHLAMYDIA PROBE AMP (~~LOC~~) NOT AT ARMC
Chlamydia: NEGATIVE
Neisseria Gonorrhea: NEGATIVE

## 2019-12-23 IMAGING — US US OB COMP LESS 14 WK
1 series · 15 of 28 positions shown · non-contrast
Comparison: None.

CLINICAL DATA: Pain

EXAM:
OBSTETRIC <14 WK ULTRASOUND
TECHNIQUE: Transabdominal ultrasound was performed for evaluation of the
gestation as well as the maternal uterus and adnexal regions.

[Series 1: us ob comp less 14 wk · 15 of 35 slices shown]
[im 1/35]
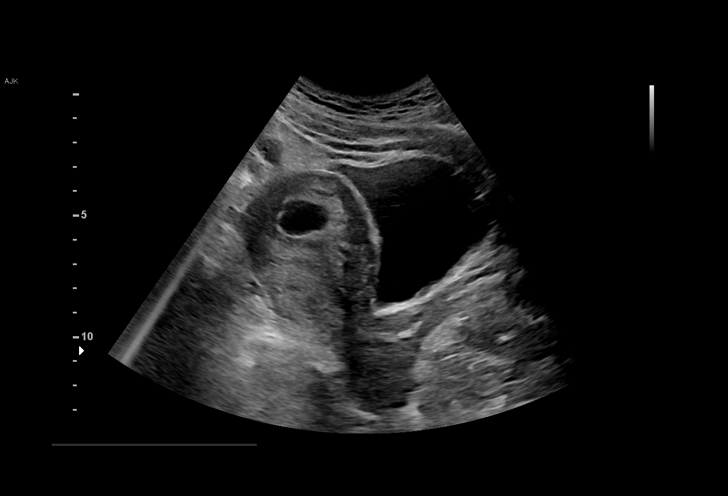
[im 3/35]
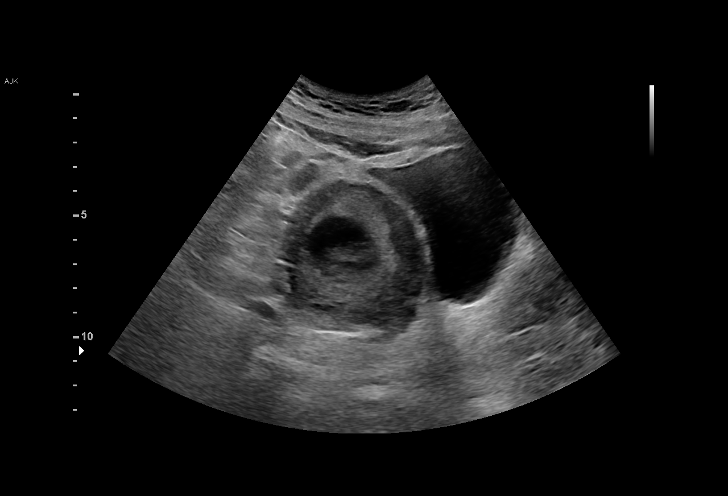
[im 6/35]
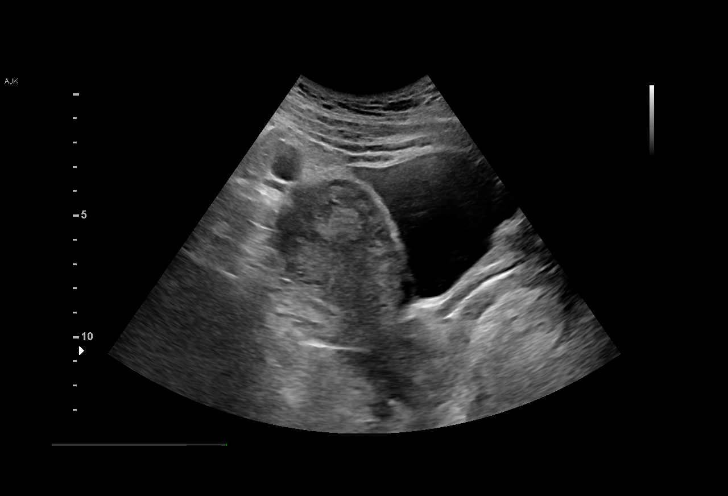
[im 8/35]
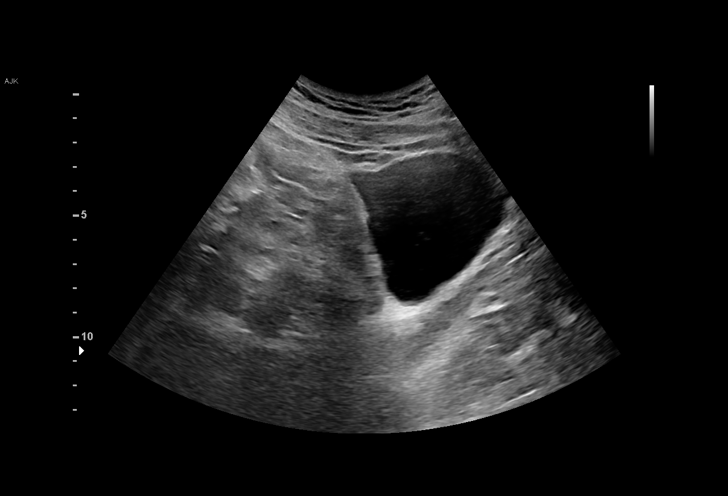
[im 11/35]
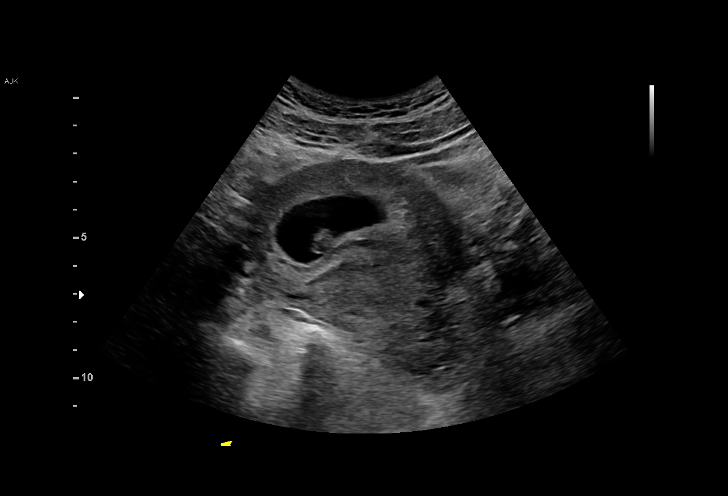
[im 13/35]
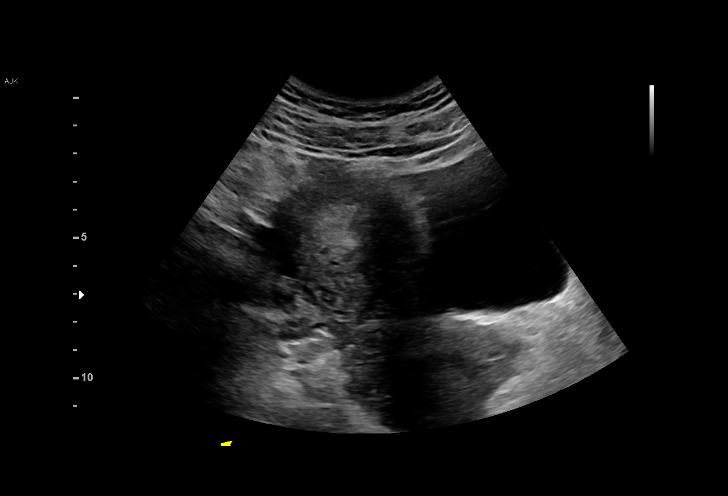
[im 16/35]
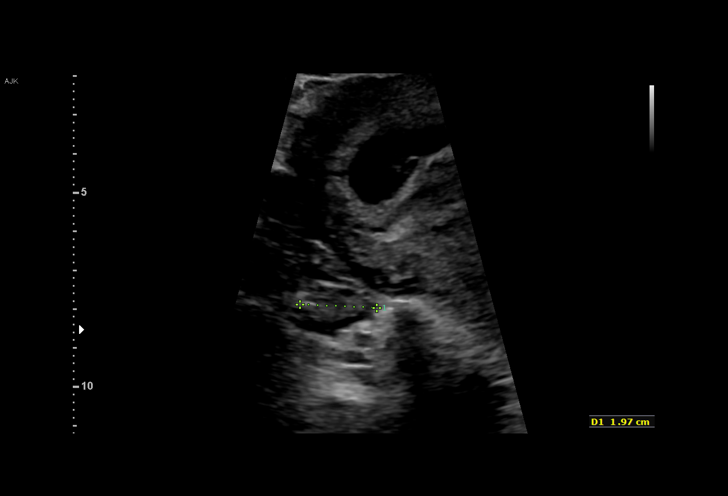
[im 18/35]
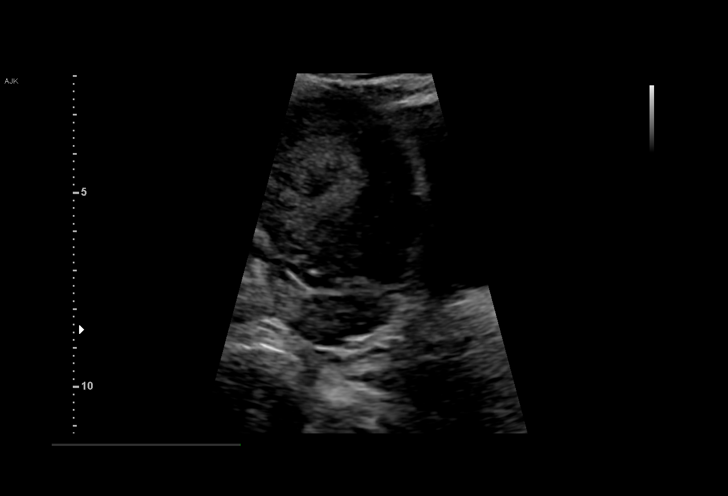
[im 19/35]
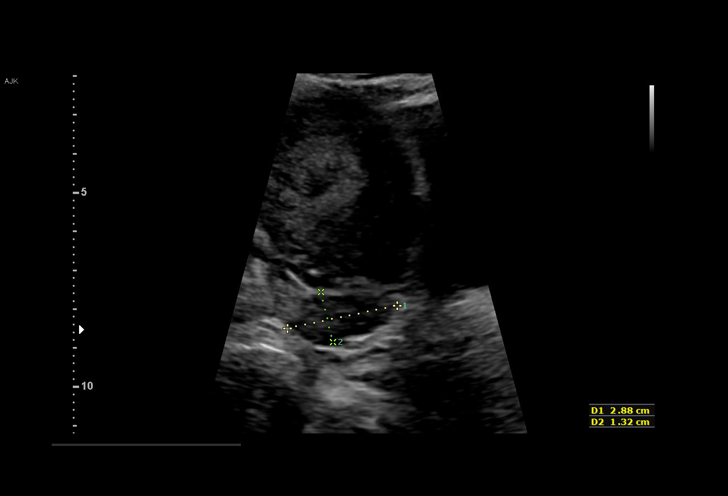
[im 22/35]
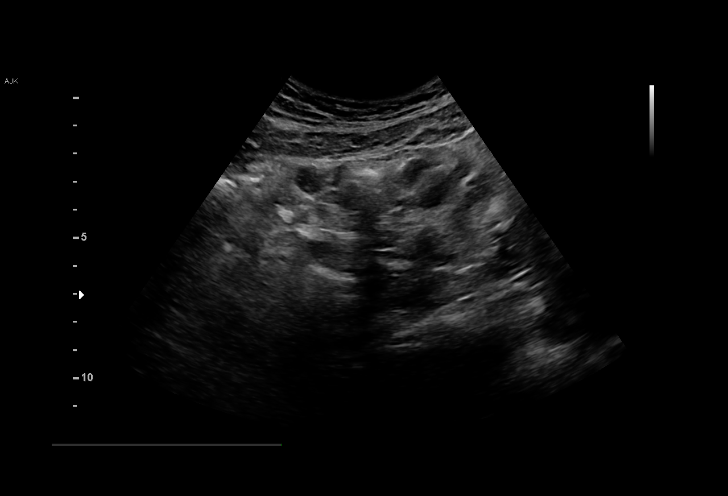
[im 24/35]
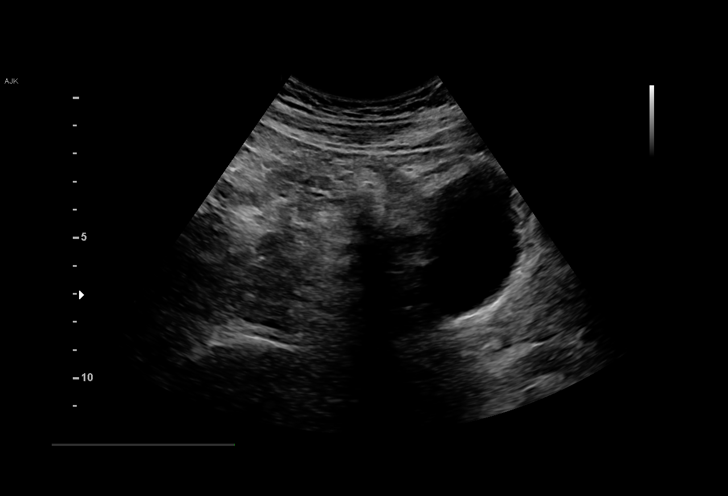
[im 27/35]
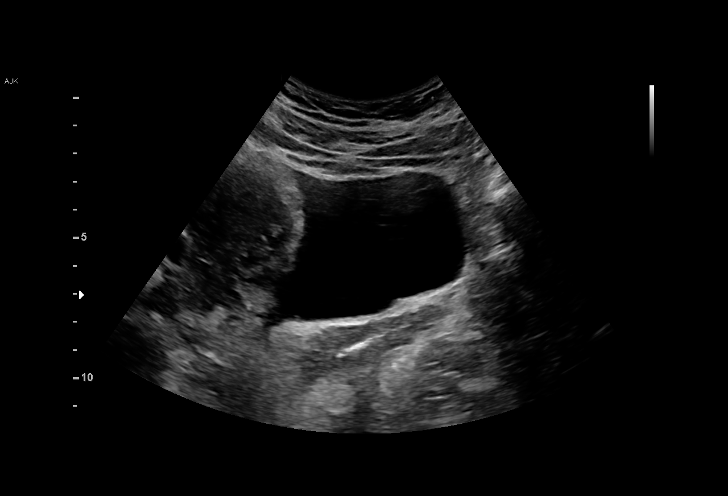
[im 29/35]
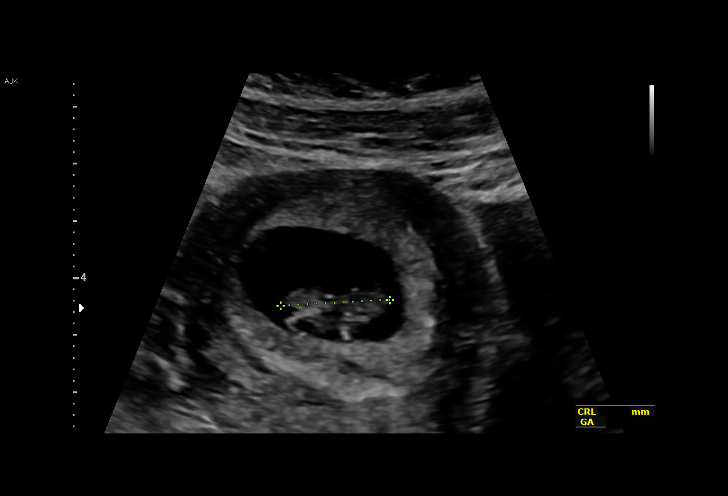
[im 32/35]
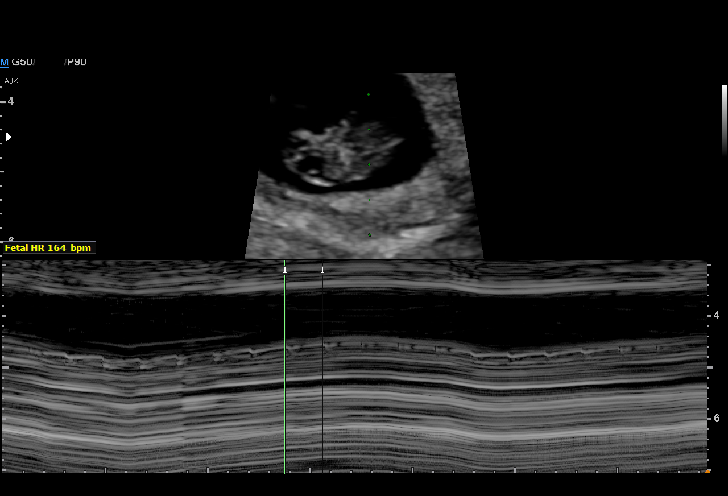
[im 35/35]
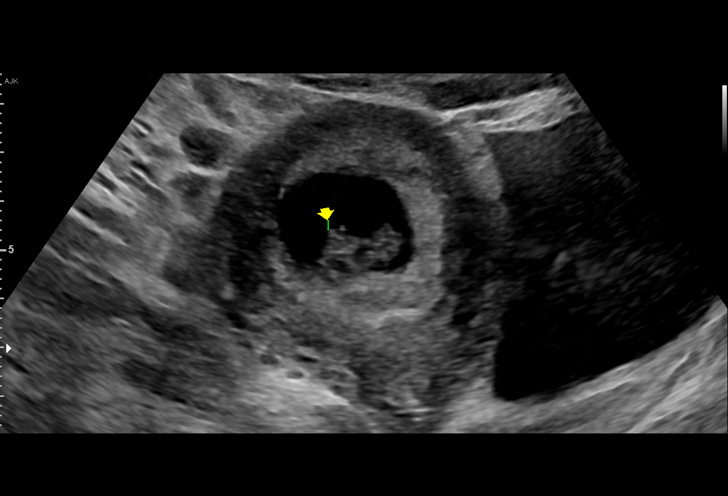

[15 of 28 positions shown; findings below may reference images not displayed]

FINDINGS: Intrauterine gestational sac: Single

Yolk sac:  Visualized.

Embryo:  Visualized.

Cardiac Activity: Visualized.

Heart Rate: 164 bpm

CRL:   19.2 mm 8 w 3 d                  US EDC: 03/10/2020

Subchorionic hemorrhage:  None visualized.

Maternal uterus/adnexae: Normal appearing right ovary. The left
ovary is not well seen.
IMPRESSION: Single live intrauterine pregnancy measuring 8 weeks 3 days.

## 2020-03-10 DIAGNOSIS — O139 Gestational [pregnancy-induced] hypertension without significant proteinuria, unspecified trimester: Secondary | ICD-10-CM

## 2021-10-28 HISTORY — PX: DILATION AND EVACUATION: SHX1459

## 2024-04-13 ENCOUNTER — Other Ambulatory Visit: Payer: Self-pay

## 2024-04-13 ENCOUNTER — Emergency Department (HOSPITAL_BASED_OUTPATIENT_CLINIC_OR_DEPARTMENT_OTHER)
Admission: EM | Admit: 2024-04-13 | Discharge: 2024-04-13 | Disposition: A | Discharge status: Home / Self Care | Attending: Emergency Medicine | Admitting: Emergency Medicine

## 2024-04-13 ENCOUNTER — Encounter (HOSPITAL_BASED_OUTPATIENT_CLINIC_OR_DEPARTMENT_OTHER): Payer: Self-pay | Admitting: Emergency Medicine

## 2024-04-13 ENCOUNTER — Emergency Department (HOSPITAL_BASED_OUTPATIENT_CLINIC_OR_DEPARTMENT_OTHER)

## 2024-04-13 DIAGNOSIS — E876 Hypokalemia: Secondary | ICD-10-CM | POA: Insufficient documentation

## 2024-04-13 DIAGNOSIS — K5900 Constipation, unspecified: Secondary | ICD-10-CM | POA: Diagnosis not present

## 2024-04-13 DIAGNOSIS — O99611 Diseases of the digestive system complicating pregnancy, first trimester: Secondary | ICD-10-CM | POA: Diagnosis not present

## 2024-04-13 DIAGNOSIS — O99281 Endocrine, nutritional and metabolic diseases complicating pregnancy, first trimester: Secondary | ICD-10-CM | POA: Diagnosis not present

## 2024-04-13 DIAGNOSIS — Z3A01 Less than 8 weeks gestation of pregnancy: Secondary | ICD-10-CM | POA: Diagnosis not present

## 2024-04-13 DIAGNOSIS — O26891 Other specified pregnancy related conditions, first trimester: Secondary | ICD-10-CM | POA: Diagnosis present

## 2024-04-13 LAB — CBC WITH DIFFERENTIAL/PLATELET
Abs Immature Granulocytes: 0.11 K/uL — ABNORMAL HIGH (ref 0.00–0.07)
Basophils Absolute: 0 K/uL (ref 0.0–0.1)
Basophils Relative: 0 %
Eosinophils Absolute: 0.1 K/uL (ref 0.0–0.5)
Eosinophils Relative: 1 %
HCT: 35.6 % — ABNORMAL LOW (ref 36.0–46.0)
Hemoglobin: 12.4 g/dL (ref 12.0–15.0)
Immature Granulocytes: 1 %
Lymphocytes Relative: 16 %
Lymphs Abs: 1.5 K/uL (ref 0.7–4.0)
MCH: 28.4 pg (ref 26.0–34.0)
MCHC: 34.8 g/dL (ref 30.0–36.0)
MCV: 81.7 fL (ref 80.0–100.0)
Monocytes Absolute: 0.7 K/uL (ref 0.1–1.0)
Monocytes Relative: 7 %
Neutro Abs: 7 K/uL (ref 1.7–7.7)
Neutrophils Relative %: 75 %
Platelets: 296 K/uL (ref 150–400)
RBC: 4.36 MIL/uL (ref 3.87–5.11)
RDW: 12.6 % (ref 11.5–15.5)
WBC: 9.4 K/uL (ref 4.0–10.5)
nRBC: 0 % (ref 0.0–0.2)

## 2024-04-13 LAB — URINALYSIS, ROUTINE W REFLEX MICROSCOPIC
Bilirubin Urine: NEGATIVE
Glucose, UA: NEGATIVE mg/dL
Hgb urine dipstick: NEGATIVE
Leukocytes,Ua: NEGATIVE
Nitrite: NEGATIVE
Specific Gravity, Urine: 1.028 (ref 1.005–1.030)
pH: 6 (ref 5.0–8.0)

## 2024-04-13 LAB — COMPREHENSIVE METABOLIC PANEL WITH GFR
ALT: 9 U/L (ref 0–44)
AST: 12 U/L — ABNORMAL LOW (ref 15–41)
Albumin: 4.2 g/dL (ref 3.5–5.0)
Alkaline Phosphatase: 82 U/L (ref 38–126)
Anion gap: 13 (ref 5–15)
BUN: 6 mg/dL (ref 6–20)
CO2: 20 mmol/L — ABNORMAL LOW (ref 22–32)
Calcium: 9.8 mg/dL (ref 8.9–10.3)
Chloride: 105 mmol/L (ref 98–111)
Creatinine, Ser: 0.59 mg/dL (ref 0.44–1.00)
GFR, Estimated: >60 mL/min (ref >60)
Glucose, Bld: 106 mg/dL — ABNORMAL HIGH (ref 70–99)
Potassium: 3.4 mmol/L — ABNORMAL LOW (ref 3.5–5.1)
Sodium: 137 mmol/L (ref 135–145)
Total Bilirubin: 0.4 mg/dL (ref 0.0–1.2)
Total Protein: 7.3 g/dL (ref 6.5–8.1)

## 2024-04-13 LAB — HCG, QUANTITATIVE, PREGNANCY: hCG, Beta Chain, Quant, S: 12802 m[IU]/mL — ABNORMAL HIGH (ref <5)

## 2024-04-13 LAB — LIPASE, BLOOD: Lipase: 21 U/L (ref 11–51)

## 2024-04-13 NOTE — ED Triage Notes (Signed)
 Pt caox4, ambulatory c/o abdominal cramping, [redacted] wks pregnant. Denies vaginal bleeding.

## 2024-04-13 NOTE — Discharge Instructions (Addendum)
 No specific cause was found for your abdominal cramping.  Your ultrasound showed a healthy intrauterine pregnancy estimated to be 5 weeks and 5 days along.  There is normal heart activity noted.  There is a small subchorionic hemorrhage which is a collection of blood between the placenta and uterus.  This can be a normal finding.  Sometimes, this can cause some vaginal bleeding.  I have included your ultrasound results below.  Please make your OB/GYN aware of these findings and follow-up with them within the next 2 weeks for routine prenatal care.  Please continue to take your prenatal vitamin.  Your kidney, liver, and pancreas labs were normal today.  Your blood counts were normal.  Your urine did not show any signs of infection.  You were found to have a slightly low potassium level on your labs today. Please increase your dietary intake of potassium with foods such as avocados, potatoes, bananas, spinach, salmon. Please have your PCP monitor this value.  If you are having trouble with constipation, please increase your intake of fiber and water at home.  You may take 1 scoop of MiraLAX  daily to help promote bowel movements.  This is an over-the-counter medication  Please return to the emergency room for worsening abdominal pain, if you are no longer having bowel movements, any other new or concerning symptoms.  EXAM: OBSTETRIC <14 WK US  AND TRANSVAGINAL OB US    TECHNIQUE: Both transabdominal and transvaginal ultrasound examinations were performed for complete evaluation of the gestation as well as the maternal uterus, adnexal regions, and pelvic cul-de-sac. Transvaginal technique was performed to assess early pregnancy.   COMPARISON:  None Available.   FINDINGS: Intrauterine gestational sac: Single   Yolk sac:  Present   Embryo:  Present   Cardiac Activity: Present   Heart Rate: 106 bpm   MSD:   mm    w     d   CRL:  2.2 mm   5 w   5 d                  US  EDC: 12/09/2024    Subchorionic hemorrhage:  Small subchorionic hemorrhage noted.   Maternal uterus/adnexae: Ovaries not identified.   IMPRESSION: 1. Single intrauterine gestation with embryo and normal cardiac activity.   2. Estimated gestational age by crown rump length equals 5 weeks 5 days.     Electronically Signed   By: Jackquline Boxer M.D.   On: 04/13/2024 12:45

## 2024-04-13 NOTE — ED Provider Notes (Signed)
 [place] EMERGENCY DEPARTMENT AT [facility] Provider Note   CSN: [id number] Arrival date & time: 04/13/24  9082     Patient presents with: Abdominal Cramping   Christine Macdonald is a 28 y.o. female who presents with lower abdominal cramping for the past 2 days.  States the cramping is fairly constant.  She reports 1 episode of nonbloody nonbilious emesis last evening.  She denies any fever, chills, or diarrhea.  Reports she does have a history of having constipation, and her last bowel movement was probably about 1 or 2 weeks ago which is normal for her.  She is still passing gas.  Reports she is about [redacted] weeks pregnant.  Denies any vaginal bleeding or vaginal discharge.    Abdominal Cramping Associated symptoms include abdominal pain.       Prior to Admission medications   Medication Sig Start Date End Date Taking? Authorizing Provider  metroNIDAZOLE  (FLAGYL ) 500 MG tablet Take 1 tablet (500 mg total) by mouth 2 (two) times daily. 08/02/19   Synthia Raisin, CNM  Prenatal Vit-Fe Fumarate-FA (PRENATAL MULTIVITAMIN) TABS tablet Take 1 tablet daily. Patient not taking: Reported on 07/23/2018 06/15/18   Wallace, Laurel S, DO    Allergies: Quetiapine    Review of Systems  Constitutional:  Negative for fever.  Gastrointestinal:  Positive for abdominal pain.    Updated Vital Signs BP 115/81   Pulse (!) 104   Temp 98.4 F (36.9 C)   Resp 20   Ht 5' 2 (1.575 m)   Wt 63.5 kg   LMP 02/17/2024 (Exact Date)   SpO2 100%   BMI 25.61 kg/m   Physical Exam Vitals and nursing note reviewed.  Constitutional:      General: She is not in acute distress.    Appearance: She is well-developed.  HENT:     Head: Normocephalic and atraumatic.  Eyes:     Conjunctiva/sclera: Conjunctivae normal.  Cardiovascular:     Rate and Rhythm: Normal rate and regular rhythm.     Heart sounds: No murmur heard. Pulmonary:     Effort: Pulmonary effort is normal. No respiratory distress.      Breath sounds: Normal breath sounds.  Abdominal:     Palpations: Abdomen is soft.     Tenderness: There is no abdominal tenderness.     Comments: Abdomen is soft and nontender to palpation.  No rebound or guarding  Musculoskeletal:        General: No swelling.     Cervical back: Neck supple.  Skin:    General: Skin is warm and dry.     Capillary Refill: Capillary refill takes less than 2 seconds.  Neurological:     Mental Status: She is alert.  Psychiatric:        Mood and Affect: Mood normal.     (all labs ordered are listed, but only abnormal results are displayed) Labs Reviewed  CBC WITH DIFFERENTIAL/PLATELET - Abnormal; Notable for the following components:      Result Value   HCT 35.6 (*)    Abs Immature Granulocytes 0.11 (*)    All other components within normal limits  HCG, QUANTITATIVE, PREGNANCY - Abnormal; Notable for the following components:   hCG, Beta Chain, Quant, S 12,802 (*)    All other components within normal limits  COMPREHENSIVE METABOLIC PANEL WITH GFR - Abnormal; Notable for the following components:   Potassium 3.4 (*)    CO2 20 (*)    Glucose, Bld 106 (*)  AST 12 (*)    All other components within normal limits  URINALYSIS, ROUTINE W REFLEX MICROSCOPIC - Abnormal; Notable for the following components:   APPearance HAZY (*)    Ketones, ur TRACE (*)    Protein, ur TRACE (*)    Bacteria, UA RARE (*)    All other components within normal limits  LIPASE, BLOOD    EKG: None  Radiology: US  OB LESS THAN 14 WEEKS WITH OB TRANSVAGINAL Result Date: 04/13/2024 CLINICAL DATA:  Pelvic cramping. Cramping during pregnancy. Estimated gestational age by last menstrual period equals 8 weeks 0 days. EXAM: OBSTETRIC <14 WK US  AND TRANSVAGINAL OB US  TECHNIQUE: Both transabdominal and transvaginal ultrasound examinations were performed for complete evaluation of the gestation as well as the maternal uterus, adnexal regions, and pelvic cul-de-sac. Transvaginal  technique was performed to assess early pregnancy. COMPARISON:  None Available. FINDINGS: Intrauterine gestational sac: Single Yolk sac:  Present Embryo:  Present Cardiac Activity: Present Heart Rate: 106 bpm MSD:   mm    w     d CRL:  2.2 mm   5 w   5 d                  US  EDC: 12/09/2024 Subchorionic hemorrhage:  Small subchorionic hemorrhage noted. Maternal uterus/adnexae: Ovaries not identified. IMPRESSION: 1. Single intrauterine gestation with embryo and normal cardiac activity. 2. Estimated gestational age by crown rump length equals 5 weeks 5 days. Electronically Signed   By: Jackquline Boxer M.D.   On: 04/13/2024 12:45     Procedures   Medications Ordered in the ED - No data to display                                  Medical Decision Making Amount and/or Complexity of Data Reviewed Labs: ordered. Radiology: ordered.     Differential diagnosis includes but is not limited to cholecystitis, cholelithiasis, cholangitis, choledocholithiasis, peptic ulcer, gastritis, gastroenteritis, appendicitis, IBS, IBD, DKA, nephrolithiasis, UTI, pyelonephritis, pancreatitis, diverticulitis, mesenteric ischemia, abdominal aortic aneurysm, small bowel obstruction, volvulus, ovarian torsion and pregnancy related concerns in females of childbearing age    ED Course:  Upon initial evaluation, patient is well-appearing, no acute distress.  Stable vitals.  Abdomen is soft and nontender.  Denies vaginal bleeding or discharge, no indication for pelvic exam at this time.  Declines any pain medication at this time.  Labs Ordered: I Ordered, and personally interpreted labs.  The pertinent results include:   CBC without leukocytosis or anemia CMP with mild hypokalemia at 3.4.  No elevation in creatinine or LFTs Lipase within normal limits Beta-hCG at 12,802 Urinalysis without nitrates or leukocytes.  Appears to be unclean catch as it has squamous epithelial cells noted   Imaging Studies ordered: I  ordered imaging studies including transvaginal ultrasound I independently visualized the imaging with scope of interpretation limited to determining acute life threatening conditions related to emergency care. Imaging showed  Single intrauterine gestation with embryo and normal cardiac activity, estimated to be at 5 weeks and 5 days Small subchorionic hemorrhage noted I agree with the radiologist interpretation   Medications Given: None  Upon re-evaluation, patient remains well-appearing with stable vitals.  Since she does not have any significant abdominal tenderness to palpation, no elevation in LFTs, creatinine, lipase, no leukocytosis, stable vitals, I have low concern for acute intra-abdominal pathology.  Urinalysis with bacteria noted but also squamous epithelial cells noted,  this appears to be unclean catch.  She is denying any urinary symptoms, no indication for treatment of UTI at this time.  Her transvaginal ultrasound revealed single intrauterine gestation with normal cardiac activity.  Estimated to be at 5 weeks and 5 days which is consistent with her beta-hCG level.  Unclear cause as to her abdominal cramping, but low concern for emergent etiology at this time. She states she follows with OB/GYN Dr. Tobie in [facility].  Will have her follow-up with Dr. Tobie for routine prenatal care. Since she is not having very regular bowel movements, discussed increasing fiber and water at home.  May use MiraLAX  at home to help with bowel movements.  She is still passing gas, able to tolerate p.o. intake, low concern for SBO at this time.  Stable and appropriate for discharge home    Impression: Constipation Abdominal cramping Healthy IUP  Disposition:  The patient was discharged home with instructions to increase fiber and water intake at home, may use MiraLAX  at home to help with constipation.  Follow-up with her OB/GYN within the next 1 to 2 weeks for routine prenatal care.  Continue  taking prenatal vitamin. Return precautions given.    This chart was dictated using voice recognition software, Dragon. Despite the best efforts of this provider to proofread and correct errors, errors may still occur which can change documentation meaning.       Final diagnoses:  Less than [redacted] weeks gestation of pregnancy  Hypokalemia    ED Discharge Orders     None          Veta Palma, [place] 04/13/24 1346    Cottie Donnice PARAS, MD 04/13/24 [phone]

## 2024-04-15 ENCOUNTER — Other Ambulatory Visit: Payer: Self-pay

## 2024-04-15 ENCOUNTER — Encounter (HOSPITAL_COMMUNITY): Payer: Self-pay | Admitting: Obstetrics and Gynecology

## 2024-04-15 ENCOUNTER — Inpatient Hospital Stay (HOSPITAL_COMMUNITY)
Admission: AD | Admit: 2024-04-15 | Discharge: 2024-04-15 | Disposition: A | Discharge status: Home / Self Care | Attending: Obstetrics and Gynecology | Admitting: Obstetrics and Gynecology

## 2024-04-15 ENCOUNTER — Inpatient Hospital Stay (HOSPITAL_COMMUNITY)

## 2024-04-15 DIAGNOSIS — Z3A01 Less than 8 weeks gestation of pregnancy: Secondary | ICD-10-CM

## 2024-04-15 DIAGNOSIS — O26891 Other specified pregnancy related conditions, first trimester: Secondary | ICD-10-CM | POA: Diagnosis not present

## 2024-04-15 DIAGNOSIS — O0931 Supervision of pregnancy with insufficient antenatal care, first trimester: Secondary | ICD-10-CM | POA: Insufficient documentation

## 2024-04-15 DIAGNOSIS — R1030 Lower abdominal pain, unspecified: Secondary | ICD-10-CM | POA: Insufficient documentation

## 2024-04-15 DIAGNOSIS — O418X10 Other specified disorders of amniotic fluid and membranes, first trimester, not applicable or unspecified: Secondary | ICD-10-CM | POA: Insufficient documentation

## 2024-04-15 DIAGNOSIS — O208 Other hemorrhage in early pregnancy: Secondary | ICD-10-CM | POA: Insufficient documentation

## 2024-04-15 DIAGNOSIS — Z3A08 8 weeks gestation of pregnancy: Secondary | ICD-10-CM | POA: Diagnosis not present

## 2024-04-15 DIAGNOSIS — O209 Hemorrhage in early pregnancy, unspecified: Secondary | ICD-10-CM

## 2024-04-15 LAB — URINALYSIS, ROUTINE W REFLEX MICROSCOPIC
Bilirubin Urine: NEGATIVE
Glucose, UA: NEGATIVE mg/dL
Hgb urine dipstick: NEGATIVE
Ketones, ur: NEGATIVE mg/dL
Leukocytes,Ua: NEGATIVE
Nitrite: NEGATIVE
Protein, ur: NEGATIVE mg/dL
Specific Gravity, Urine: 1.016 (ref 1.005–1.030)
pH: 6 (ref 5.0–8.0)

## 2024-04-15 LAB — CBC
HCT: 36.5 % (ref 36.0–46.0)
Hemoglobin: 12.4 g/dL (ref 12.0–15.0)
MCH: 29 pg (ref 26.0–34.0)
MCHC: 34 g/dL (ref 30.0–36.0)
MCV: 85.3 fL (ref 80.0–100.0)
Platelets: 253 K/uL (ref 150–400)
RBC: 4.28 MIL/uL (ref 3.87–5.11)
RDW: 12.5 % (ref 11.5–15.5)
WBC: 11.4 K/uL — ABNORMAL HIGH (ref 4.0–10.5)
nRBC: 0 % (ref 0.0–0.2)

## 2024-04-15 LAB — WET PREP, GENITAL
Clue Cells Wet Prep HPF POC: NONE SEEN
Sperm: NONE SEEN
Trich, Wet Prep: NONE SEEN
WBC, Wet Prep HPF POC: <10 (ref <10)
Yeast Wet Prep HPF POC: NONE SEEN

## 2024-04-15 LAB — HCG, QUANTITATIVE, PREGNANCY: hCG, Beta Chain, Quant, S: 28279 m[IU]/mL — ABNORMAL HIGH (ref <5)

## 2024-04-15 NOTE — Discharge Instructions (Addendum)
 Reasons to return to MAU at [facility]: If you have heavier bleeding that soaks through more that 2 pads per hour for an hour or more If you bleed so much that you feel like you might pass out or you do pass out If you have significant abdominal pain that is not improved with Tylenol  1000 mg every 8 hours as needed for pain If you develop a fever > 100.5   [employer] [facility] [employer] for [employer] at [employer] [facility]             [address] [place], [place] [address] [phone]  Center for [employer] at [facility]                                                             [address], [address], [place], [place], [address] [phone]  Center for [facility] at [facility] [address], [place], [place], [address] [phone]  Center for [facility] at [facility] [address], [place], [place], [address] [phone]  Center for [facility] Healthcare at [facility]                                 [address] [place], [place], [place], [address] [phone]  Center for [facility] at [facility]                                    [address] [place], [place], [address] [phone]  Center for [facility] Healthcare at [facility] [address], [place], [place], [address]                              [facility] [address], [place], [place], [address] [phone]  [facility]         Phone: [phone]  [facility]      Phone: [phone]   [facility]      Phone: [phone]  [facility]         Phone: [phone]   [facility]-Maternity    Phone: [phone]  [facility]      Phone: [phone]  [facility] [place]     Phone: [phone]  [facility]        Phone: [phone]  [facility] ([address] [place]) [phone]  [facility]      Phone: [phone]

## 2024-04-15 NOTE — MAU Note (Signed)
 Christine Macdonald is a 28 y.o. at Unknown here in MAU reporting: cramping that started a week ago, small amount of bleeding that started this am, 3 pads used today. [facility] to [facility] today and they could not see a heartbeat on US .   LMP: 02/17/24 Onset of complaint: this am Pain score: 7/10 Vitals:   04/15/24 1705  BP: 111/64  Resp: 16  Temp: 98.5 F (36.9 C)  SpO2: 99%     FHT: na  Lab orders placed from triage: see order

## 2024-04-15 NOTE — MAU Provider Note (Addendum)
 Chief Complaint:  Abdominal Pain and Vaginal Bleeding   HPI   None     Christine Macdonald is a 28 y.o. G3P1001 at [redacted]w[redacted]d who presents to maternity admissions reporting vaginal bleeding and lower abdominal cramping.  She reports that cramping started about a week ago.  Light bleeding started this morning, she has changed her pad twice today.  She went to the pregnancy network and they were unable to see a heartbeat on ultrasound.  Pregnancy Course: No prenatal care.  Past Medical History:  Diagnosis Date   Medical history non-contributory    OB History  Gravida Para Term Preterm AB Living  3 1 1   1   SAB IAB Ectopic Multiple Live Births     0 1    # Outcome Date GA Lbr Len/2nd Weight Sex Type Anes PTL Lv  3 Current           2 Term 06/14/18 [redacted]w[redacted]d 03:45 / 02:09 2520 g M Vag-Spont EPI  LIV  1 Gravida            Past Surgical History:  Procedure Laterality Date   DILATION AND EVACUATION     NO PAST SURGERIES     No family history on file. Social History   Tobacco Use   Smoking status: Never   Smokeless tobacco: Never  Vaping Use   Vaping status: Never Used  Substance Use Topics   Alcohol use: Not Currently   Drug use: Yes    Types: Cocaine, Other-see comments    Comment: Xanax   Allergies  Allergen Reactions   Quetiapine Shortness Of Breath   Medications Prior to Admission  Medication Sig Dispense Refill Last Dose/Taking   metroNIDAZOLE  (FLAGYL ) 500 MG tablet Take 1 tablet (500 mg total) by mouth 2 (two) times daily. 14 tablet 0    Prenatal Vit-Fe Fumarate-FA (PRENATAL MULTIVITAMIN) TABS tablet Take 1 tablet daily. (Patient not taking: Reported on 07/23/2018) 30 tablet 6     I have reviewed patient's Past Medical Hx, Surgical Hx, Family Hx, Social Hx, medications and allergies.   ROS  Pertinent items noted in HPI and remainder of comprehensive ROS otherwise negative.   PHYSICAL EXAM  Patient Vitals for the past 24 hrs:  BP Temp Temp src Resp SpO2 Height  Weight  04/15/24 1705 111/64 98.5 F (36.9 C) Oral 16 99 % 5' 2 (1.575 m) 58.5 kg    Constitutional: Well-developed, well-nourished female in no acute distress.  HEENT: atraumatic, normocephalic. Neck has normal ROM. EOM intact. Cardiovascular: normal rate & rhythm, warm and well-perfused Respiratory: normal effort, no problems with respiration noted GI: Abd soft, non-tender, non-distended MSK: Extremities nontender, no edema, normal ROM Skin: warm and dry. Acyanotic, no jaundice or pallor. Neurologic: Alert and oriented x 4. No abnormal coordination. Psychiatric: Normal mood. Speech not slurred, not rapid/pressured. Patient is cooperative. GU: no CVA tenderness   Labs: Results for orders placed or performed during the hospital encounter of 04/15/24 (from the past 24 hours)  Wet prep, genital     Status: None   Collection Time: 04/15/24  5:10 PM   Specimen: PATH Cytology Cervicovaginal Ancillary Only  Result Value Ref Range   Yeast Wet Prep HPF POC NONE SEEN NONE SEEN   Trich, Wet Prep NONE SEEN NONE SEEN   Clue Cells Wet Prep HPF POC NONE SEEN NONE SEEN   WBC, Wet Prep HPF POC <10 <10   Sperm NONE SEEN   Urinalysis, Routine w reflex microscopic -Urine, Clean  Catch     Status: Abnormal   Collection Time: 04/15/24  5:15 PM  Result Value Ref Range   Color, Urine YELLOW YELLOW   APPearance HAZY (A) CLEAR   Specific Gravity, Urine 1.016 1.005 - 1.030   pH 6.0 5.0 - 8.0   Glucose, UA NEGATIVE NEGATIVE mg/dL   Hgb urine dipstick NEGATIVE NEGATIVE   Bilirubin Urine NEGATIVE NEGATIVE   Ketones, ur NEGATIVE NEGATIVE mg/dL   Protein, ur NEGATIVE NEGATIVE mg/dL   Nitrite NEGATIVE NEGATIVE   Leukocytes,Ua NEGATIVE NEGATIVE  CBC     Status: Abnormal   Collection Time: 04/15/24  5:19 PM  Result Value Ref Range   WBC 11.4 (H) 4.0 - 10.5 K/uL   RBC 4.28 3.87 - 5.11 MIL/uL   Hemoglobin 12.4 12.0 - 15.0 g/dL   HCT 63.4 63.9 - 53.9 %   MCV 85.3 80.0 - 100.0 fL   MCH 29.0 26.0 - 34.0  pg   MCHC 34.0 30.0 - 36.0 g/dL   RDW 87.4 88.4 - 84.4 %   Platelets 253 150 - 400 K/uL   nRBC 0.0 0.0 - 0.2 %  ABO/Rh     Status: None (Preliminary result)   Collection Time: 04/15/24  5:19 PM  Result Value Ref Range   ABO/RH(D) PENDING     Imaging:  US  OB Transvaginal Result Date: 04/15/2024 CLINICAL DATA:  [id number] Vaginal bleeding in pregnancy, first trimester [id number] EXAM: TRANSVAGINAL OB ULTRASOUND TECHNIQUE: Transvaginal ultrasound was performed for complete evaluation of the gestation as well as the maternal uterus, adnexal regions, and pelvic cul-de-sac. COMPARISON:  April 13, 2024 FINDINGS: Intrauterine gestational sac: Single Yolk sac:  Present Fetal Pole:  Present Cardiac Activity: Present Heart Rate: 117 bpm CRL:  4.6 mm  6 w 1 d                  US  EDC: 12/08/2024 Subchorionic hemorrhage: Increasing curvilinear hypoechogenicity surrounding the gestational sac, measuring 0.6 x 3 cm Maternal uterus/adnexae: Neither ovary was visualized due to overlying bowel gas. No free pelvic fluid. IMPRESSION: 1. Single, live intrauterine gestation with an estimated gestational age of [redacted] weeks, 1 day. Fetal heart rate of 117 beats per minute. 2. Increasing curvilinear hypoechogenicity surrounding the gestational sac, measuring 0.6 x 3 cm, consistent with at least a moderate sized subchronic hematoma. Continued close sonographic follow-up recommended. Electronically Signed   By: Rogelia Myers M.D.   On: 04/15/2024 17:57   MDM & MAU COURSE  MDM: Moderate  MAU Course: -Vital signs within normal limits.  -PERU for ectopic rule out. -UA and wet prep for infection. -CBC within normal limits for pregnancy. US  shows IUGS, YS, fetal pole, and cardiac activity with EGA [redacted]w[redacted]d warranting redating of pregnancy. Also notes subchorionic hematoma. -UA and wet prep negative for infection. -hCG pending. Recommend follow up with OB to establish prenatal care.  Differential diagnosis considered for 1st trimester  vaginal bleeding includes but is not limited to: ectopic pregnancy, complete spontaneous abortion, incomplete abortion, missed abortion, threatened abortion, embryonic/fetal demise, cervical insufficiency, cervical or vaginal disorder   Orders Placed This Encounter  Procedures   Wet prep, genital   US  OB Transvaginal   CBC   hCG, quantitative, pregnancy   RPR   Urinalysis, Routine w reflex microscopic -Urine, Clean Catch   Diet NPO time specified   ABO/Rh   Discharge patient   No orders of the defined types were placed in this encounter.   ASSESSMENT   1. Vaginal bleeding  in pregnancy, first trimester   2. Subchorionic hematoma in first trimester, single or unspecified fetus   3. [redacted] weeks gestation of pregnancy     PLAN  Discharge home in stable condition with bleeding precautions.  Provided list of OBs to establish prenatal care.     Allergies as of 04/15/2024       Reactions   Quetiapine Shortness Of Breath        Medication List     STOP taking these medications    metroNIDAZOLE  500 MG tablet Commonly known as: Flagyl        TAKE these medications    prenatal multivitamin Tabs tablet Take 1 tablet daily.        Joesph DELENA Sear, PA

## 2024-04-16 LAB — ABO/RH: ABO/RH(D): O POS

## 2024-04-16 LAB — GC/CHLAMYDIA PROBE AMP (~~LOC~~) NOT AT ARMC
Chlamydia: NEGATIVE
Comment: NEGATIVE
Comment: NORMAL
Neisseria Gonorrhea: NEGATIVE

## 2024-04-16 LAB — RPR: RPR Ser Ql: NONREACTIVE

## 2024-04-21 ENCOUNTER — Inpatient Hospital Stay (HOSPITAL_COMMUNITY)
Admission: AD | Admit: 2024-04-21 | Discharge: 2024-04-21 | Disposition: A | Discharge status: Home / Self Care | Payer: Self-pay | Attending: Family Medicine | Admitting: Family Medicine

## 2024-04-21 DIAGNOSIS — Z3A01 Less than 8 weeks gestation of pregnancy: Secondary | ICD-10-CM

## 2024-04-21 DIAGNOSIS — M79605 Pain in left leg: Secondary | ICD-10-CM | POA: Diagnosis not present

## 2024-04-21 DIAGNOSIS — O26891 Other specified pregnancy related conditions, first trimester: Secondary | ICD-10-CM

## 2024-04-21 DIAGNOSIS — R109 Unspecified abdominal pain: Secondary | ICD-10-CM | POA: Insufficient documentation

## 2024-04-21 DIAGNOSIS — M79604 Pain in right leg: Secondary | ICD-10-CM | POA: Insufficient documentation

## 2024-04-21 DIAGNOSIS — O209 Hemorrhage in early pregnancy, unspecified: Secondary | ICD-10-CM | POA: Insufficient documentation

## 2024-04-21 DIAGNOSIS — R102 Pelvic and perineal pain: Secondary | ICD-10-CM

## 2024-04-21 LAB — URINALYSIS, ROUTINE W REFLEX MICROSCOPIC
Bilirubin Urine: NEGATIVE
Glucose, UA: NEGATIVE mg/dL
Hgb urine dipstick: NEGATIVE
Ketones, ur: NEGATIVE mg/dL
Leukocytes,Ua: NEGATIVE
Nitrite: NEGATIVE
Protein, ur: NEGATIVE mg/dL
Specific Gravity, Urine: 1.016 (ref 1.005–1.030)
pH: 6 (ref 5.0–8.0)

## 2024-04-21 NOTE — MAU Note (Signed)
 MAU Triage Note:  .Christine Macdonald is a 28 y.o. at [redacted]w[redacted]d here in MAU reporting: lower abdominal cramping that first began last week - pain is consistent, but will increase in intensity and then come back down to baseline. Pain has continued since she was last seen in MAU and dx with [facility]. Denies VB or abnormal discharge.  Patient complaint: Sever cramping  Pain Score: 8  Pain Location: Abdomen    Onset of complaint: on-going LMP: Patient's last menstrual period was 02/17/2024 (exact date).  Vitals:   04/21/24 1920  BP: 119/85  Pulse: 88  Resp: 16  Temp: 99.2 F (37.3 C)  SpO2: 100%     Lab orders placed from triage: UA

## 2024-04-21 NOTE — Discharge Instructions (Signed)
 You were seen for cramping. This is likely due to your known subchorionic hemorrhage, as this is a common side effect. You can take tylenol  for this pain but unfortunately we do not have any good treatments to stop it.   Return to the MAU if you develop heavy bleeding, severe abdominal pain, or fever.

## 2024-04-21 NOTE — MAU Provider Note (Signed)
 History     [id number]  Arrival date and time: 04/21/24 1813    Chief Complaint  Patient presents with   Abdominal Pain     HPI Christine Macdonald is a 28 y.o. at [redacted]w[redacted]d by 6 wk US  with PMHx notable for known subchrionic hemorrhage, who presents for abdominal cramping and leg pain.   Patient seen in [place] week prior for vaginal bleeding cramping Had US  that showed live IUP Today reports ongoing cramping and bilateral leg aching No vaginal bleeding Wants to make sure everything is OK   --/--/O POS (07/08 1719)  OB History     Gravida  3   Para  1   Term  1   Preterm      AB      Living  1      SAB      IAB      Ectopic      Multiple  0   Live Births  1           Past Medical History:  Diagnosis Date   Medical history non-contributory     Past Surgical History:  Procedure Laterality Date   DILATION AND EVACUATION     NO PAST SURGERIES      No family history on file.  Social History   Socioeconomic History   Marital status: Single    Spouse name: Not on file   Number of children: Not on file   Years of education: Not on file   Highest education level: Not on file  Occupational History   Occupation: unemployed   Tobacco Use   Smoking status: Never   Smokeless tobacco: Never  Vaping Use   Vaping status: Never Used  Substance and Sexual Activity   Alcohol use: Not Currently   Drug use: Yes    Types: Cocaine, Other-see comments    Comment: Xanax   Sexual activity: Yes  Other Topics Concern   Not on file  Social History Narrative   Not on file   Social Drivers of Health   Financial Resource Strain: Low Risk  (06/13/2018)   Overall Financial Resource Strain (CARDIA)    Difficulty of Paying Living Expenses: Not hard at all  Food Insecurity: No Food Insecurity (06/13/2018)   Hunger Vital Sign    Worried About Running Out of Food in the Last Year: Never true    Ran Out of Food in the Last Year: Never true  Transportation Needs: No  Transportation Needs (06/13/2018)   PRAPARE - Administrator, Civil Service (Medical): No    Lack of Transportation (Non-Medical): No  Physical Activity: Inactive (06/13/2018)   Exercise Vital Sign    Days of Exercise per Week: 0 days    Minutes of Exercise per Session: 0 min  Stress: No Stress Concern Present (06/13/2018)   [employer] of Occupational Health - Occupational Stress Questionnaire    Feeling of Stress : Not at all  Social Connections: Unknown (02/20/2022)   Received from [facility]   Social Network    Social Network: Not on file  Intimate Partner Violence: Unknown (01/12/2022)   Received from [facility]   HITS    Physically Hurt: Not on file    Insult or Talk Down To: Not on file    Threaten Physical Harm: Not on file    Scream or Curse: Not on file    Allergies  Allergen Reactions   Quetiapine Shortness Of Breath  No current facility-administered medications on file prior to encounter.   Current Outpatient Medications on File Prior to Encounter  Medication Sig Dispense Refill   Prenatal Vit-Fe Fumarate-FA (PRENATAL MULTIVITAMIN) TABS tablet Take 1 tablet daily. (Patient not taking: Reported on 07/23/2018) 30 tablet 6     ROS Pertinent positives and negative per HPI, all others reviewed and negative  Physical Exam   BP 119/85 (BP Location: Right Arm)   Pulse 88   Temp 99.2 F (37.3 C) (Oral)   Resp 16   Ht 5' 2 (1.575 m)   Wt 56.9 kg   LMP 02/17/2024 (Exact Date)   SpO2 100%   BMI 22.94 kg/m   Patient Vitals for the past 24 hrs:  BP Temp Temp src Pulse Resp SpO2 Height Weight  04/21/24 1920 119/85 99.2 F (37.3 C) Oral 88 16 100 % 5' 2 (1.575 m) 56.9 kg    Physical Exam Vitals reviewed.  Constitutional:      General: She is not in acute distress.    Appearance: She is well-developed. She is not diaphoretic.  Eyes:     General: No scleral icterus. Pulmonary:     Effort: Pulmonary effort is normal. No respiratory  distress.  Abdominal:     General: There is no distension.     Palpations: Abdomen is soft.     Tenderness: There is no abdominal tenderness. There is no guarding or rebound.  Skin:    General: Skin is warm and dry.  Neurological:     Mental Status: She is alert.     Coordination: Coordination normal.      Cervical Exam    Bedside Ultrasound Pt informed that the ultrasound is considered a limited OB ultrasound and is not intended to be a complete ultrasound exam.  Patient also informed that the ultrasound is not being completed with the intent of assessing for fetal or placental anomalies or any pelvic abnormalities.  Explained that the purpose of today's ultrasound is to assess for  viability.  Patient acknowledges the purpose of the exam and the limitations of the study.      My interpretation: First trimester findings: Intrauterine gestational sac seen: yes Gestational sac summary: fetal pole seen Fetal cardiac activity: present 148 bpm by M mode   Labs Results for orders placed or performed during the hospital encounter of 04/21/24 (from the past 24 hours)  Urinalysis, Routine w reflex microscopic -Urine, Clean Catch     Status: Abnormal   Collection Time: 04/21/24  7:21 PM  Result Value Ref Range   Color, Urine YELLOW YELLOW   APPearance CLOUDY (A) CLEAR   Specific Gravity, Urine 1.016 1.005 - 1.030   pH 6.0 5.0 - 8.0   Glucose, UA NEGATIVE NEGATIVE mg/dL   Hgb urine dipstick NEGATIVE NEGATIVE   Bilirubin Urine NEGATIVE NEGATIVE   Ketones, ur NEGATIVE NEGATIVE mg/dL   Protein, ur NEGATIVE NEGATIVE mg/dL   Nitrite NEGATIVE NEGATIVE   Leukocytes,Ua NEGATIVE NEGATIVE    Imaging No results found.  MAU Course  Procedures Lab Orders         Urinalysis, Routine w reflex microscopic -Urine, Clean Catch    No orders of the defined types were placed in this encounter.  Imaging Orders  No imaging studies ordered today    MDM Moderate (Level 3-4)  Assessment and  Plan  #Abdominal pain in pregnancy, first trimester #[redacted] weeks gestation of pregnancy Reassured patient that all appears well on bedside US . Discussed cramping is common  with [facility] due to irritation to the uterus, unfortunately no direct treatments exist, provided reassurance.     Dispo: discharged to home in stable condition    Donnice CHRISTELLA Carolus, MD/MPH 04/21/24 8:52 PM  Allergies as of 04/21/2024       Reactions   Quetiapine Shortness Of Breath        Medication List     TAKE these medications    prenatal multivitamin Tabs tablet Take 1 tablet daily.

## 2024-04-23 ENCOUNTER — Encounter: Payer: Self-pay | Admitting: Obstetrics and Gynecology

## 2024-04-23 ENCOUNTER — Ambulatory Visit (INDEPENDENT_AMBULATORY_CARE_PROVIDER_SITE_OTHER): Admitting: Obstetrics and Gynecology

## 2024-04-23 ENCOUNTER — Other Ambulatory Visit (HOSPITAL_COMMUNITY): Payer: Self-pay

## 2024-04-23 VITALS — BP 120/76 | HR 102 | Wt 126.0 lb

## 2024-04-23 DIAGNOSIS — Z3A01 Less than 8 weeks gestation of pregnancy: Secondary | ICD-10-CM

## 2024-04-23 DIAGNOSIS — O26899 Other specified pregnancy related conditions, unspecified trimester: Secondary | ICD-10-CM | POA: Diagnosis not present

## 2024-04-23 DIAGNOSIS — O418X10 Other specified disorders of amniotic fluid and membranes, first trimester, not applicable or unspecified: Secondary | ICD-10-CM | POA: Diagnosis not present

## 2024-04-23 DIAGNOSIS — O468X1 Other antepartum hemorrhage, first trimester: Secondary | ICD-10-CM

## 2024-04-23 DIAGNOSIS — Z3480 Encounter for supervision of other normal pregnancy, unspecified trimester: Secondary | ICD-10-CM

## 2024-04-23 DIAGNOSIS — R252 Cramp and spasm: Secondary | ICD-10-CM | POA: Diagnosis not present

## 2024-04-23 DIAGNOSIS — R109 Unspecified abdominal pain: Secondary | ICD-10-CM

## 2024-04-23 MED ORDER — CYCLOBENZAPRINE HCL 5 MG PO TABS
5.0000 mg | ORAL_TABLET | Freq: Three times a day (TID) | ORAL | 0 refills | Status: DC | PRN
  Filled 2024-04-23: qty 20, 7d supply, fill #0

## 2024-04-23 NOTE — Progress Notes (Signed)
   GYNECOLOGY PROGRESS NOTE  History:  28 y.o. G3P1001 presents to [facility] for follow up hospital visit. Went to hospital for worsening cramping. That was causing her shortness of breath. Hx of SAB, was dx with subchorionic hematoma. Denies bleeding, would like reassurance given SAB hx. Has had [facility] in previous pregnancy.   Health Maintenance Due  Topic Date Due   Hepatitis C Screening  Never done   Hepatitis B Vaccines (1 of 3 - 19+ 3-dose series) Never done   HPV VACCINES (1 - 3-dose SCDM series) Never done   Cervical Cancer Screening (Pap smear)  07/23/2021   COVID-19 Vaccine (1 - 2024-25 season) Never done     Review of Systems:  Pertinent items are noted in HPI.   Objective:  Physical Exam Blood pressure 120/76, pulse (!) 102, weight 126 lb (57.2 kg), last menstrual period 02/17/2024, unknown if currently breastfeeding. VS reviewed, nursing note reviewed,  Constitutional: well developed, well nourished, no distress HEENT: normocephalic CV: normal rate Pulm/chest wall: normal effort Abdomen: soft Neuro: alert and oriented  Skin: warm, dry, no rash Psych: affect normal Pelvic exam: deferred  Assessment & Plan:  1. Subchorionic hematoma in first trimester, single or unspecified fetus (Primary 2. [redacted] weeks gestation of pregnancy 7/8 u/s single live IUP [place] hematoma noted 0.6x3cm 7/15 limited bedside u/s mau stable Limited view bedside u/s today , fhr detected   3. Abdominal pain affecting pregnancy UA neg, wet prep at hospital neg Discussed precautions when to return to MAU Continue tylenol  prn, can trail flexeril    4. Leg cramping Discussed 60-80 oz water, mag oxide, cbc  normal at hospital, defer iron supplement for now. Monitor for varicose veins, erythema, worsening pain, precautions when to follow up   Nidia Daring, FNP

## 2024-04-23 NOTE — Progress Notes (Signed)
 Pt presents for hospital f/u. Pt reports severe cramping and pain on the left side and Hx of SAB. Pt also reports swelling and bruising in the legs

## 2024-04-24 ENCOUNTER — Other Ambulatory Visit (HOSPITAL_COMMUNITY)
Admission: RE | Admit: 2024-04-24 | Discharge: 2024-04-24 | Disposition: A | Discharge status: Home / Self Care | Source: Ambulatory Visit | Attending: Obstetrics and Gynecology | Admitting: Obstetrics and Gynecology

## 2024-04-24 ENCOUNTER — Other Ambulatory Visit (HOSPITAL_COMMUNITY): Payer: Self-pay

## 2024-04-24 DIAGNOSIS — R109 Unspecified abdominal pain: Secondary | ICD-10-CM | POA: Insufficient documentation

## 2024-04-24 DIAGNOSIS — O26899 Other specified pregnancy related conditions, unspecified trimester: Secondary | ICD-10-CM | POA: Insufficient documentation

## 2024-04-24 NOTE — Addendum Note (Signed)
 Addended by: Madellyn Denio V on: 04/24/2024 09:52 AM   Modules accepted: Orders

## 2024-04-25 LAB — CERVICOVAGINAL ANCILLARY ONLY
Bacterial Vaginitis (gardnerella): NEGATIVE
Candida Glabrata: NEGATIVE
Candida Vaginitis: NEGATIVE
Chlamydia: NEGATIVE
Comment: NEGATIVE
Comment: NEGATIVE
Comment: NEGATIVE
Comment: NEGATIVE
Comment: NEGATIVE
Comment: NORMAL
Neisseria Gonorrhea: NEGATIVE
Trichomonas: NEGATIVE

## 2024-04-26 ENCOUNTER — Ambulatory Visit: Payer: Self-pay | Admitting: Obstetrics and Gynecology

## 2024-05-08 ENCOUNTER — Encounter

## 2024-05-12 ENCOUNTER — Inpatient Hospital Stay (HOSPITAL_COMMUNITY)

## 2024-05-12 ENCOUNTER — Inpatient Hospital Stay (HOSPITAL_COMMUNITY)
Admission: AD | Admit: 2024-05-12 | Discharge: 2024-05-12 | Disposition: A | Discharge status: Home / Self Care | Attending: Obstetrics and Gynecology | Admitting: Obstetrics and Gynecology

## 2024-05-12 ENCOUNTER — Encounter (HOSPITAL_COMMUNITY): Payer: Self-pay | Admitting: Obstetrics and Gynecology

## 2024-05-12 DIAGNOSIS — R197 Diarrhea, unspecified: Secondary | ICD-10-CM | POA: Diagnosis not present

## 2024-05-12 DIAGNOSIS — O210 Mild hyperemesis gravidarum: Secondary | ICD-10-CM

## 2024-05-12 DIAGNOSIS — O219 Vomiting of pregnancy, unspecified: Secondary | ICD-10-CM | POA: Insufficient documentation

## 2024-05-12 DIAGNOSIS — O209 Hemorrhage in early pregnancy, unspecified: Secondary | ICD-10-CM | POA: Diagnosis present

## 2024-05-12 DIAGNOSIS — Z3A10 10 weeks gestation of pregnancy: Secondary | ICD-10-CM

## 2024-05-12 DIAGNOSIS — R300 Dysuria: Secondary | ICD-10-CM | POA: Diagnosis not present

## 2024-05-12 DIAGNOSIS — O99891 Other specified diseases and conditions complicating pregnancy: Secondary | ICD-10-CM | POA: Diagnosis not present

## 2024-05-12 DIAGNOSIS — O4691 Antepartum hemorrhage, unspecified, first trimester: Secondary | ICD-10-CM | POA: Insufficient documentation

## 2024-05-12 DIAGNOSIS — O418X10 Other specified disorders of amniotic fluid and membranes, first trimester, not applicable or unspecified: Secondary | ICD-10-CM

## 2024-05-12 LAB — URINALYSIS, ROUTINE W REFLEX MICROSCOPIC
Bilirubin Urine: NEGATIVE
Glucose, UA: NEGATIVE mg/dL
Hgb urine dipstick: NEGATIVE
Ketones, ur: 5 mg/dL — AB
Leukocytes,Ua: NEGATIVE
Nitrite: NEGATIVE
Protein, ur: 30 mg/dL — AB
Specific Gravity, Urine: 1.026 (ref 1.005–1.030)
pH: 5 (ref 5.0–8.0)

## 2024-05-12 LAB — CBC
HCT: 38.4 % (ref 36.0–46.0)
Hemoglobin: 13 g/dL (ref 12.0–15.0)
MCH: 28.6 pg (ref 26.0–34.0)
MCHC: 33.9 g/dL (ref 30.0–36.0)
MCV: 84.4 fL (ref 80.0–100.0)
Platelets: 304 K/uL (ref 150–400)
RBC: 4.55 MIL/uL (ref 3.87–5.11)
RDW: 12.8 % (ref 11.5–15.5)
WBC: 11.4 K/uL — ABNORMAL HIGH (ref 4.0–10.5)
nRBC: 0 % (ref 0.0–0.2)

## 2024-05-12 LAB — COMPREHENSIVE METABOLIC PANEL WITH GFR
ALT: 10 U/L (ref 0–44)
AST: 19 U/L (ref 15–41)
Albumin: 3.2 g/dL — ABNORMAL LOW (ref 3.5–5.0)
Alkaline Phosphatase: 55 U/L (ref 38–126)
Anion gap: 11 (ref 5–15)
BUN: 6 mg/dL (ref 6–20)
CO2: 21 mmol/L — ABNORMAL LOW (ref 22–32)
Calcium: 9.1 mg/dL (ref 8.9–10.3)
Chloride: 105 mmol/L (ref 98–111)
Creatinine, Ser: 0.62 mg/dL (ref 0.44–1.00)
GFR, Estimated: >60 mL/min (ref >60)
Glucose, Bld: 123 mg/dL — ABNORMAL HIGH (ref 70–99)
Potassium: 3.4 mmol/L — ABNORMAL LOW (ref 3.5–5.1)
Sodium: 137 mmol/L (ref 135–145)
Total Bilirubin: 0.5 mg/dL (ref 0.0–1.2)
Total Protein: 6.7 g/dL (ref 6.5–8.1)

## 2024-05-12 LAB — WET PREP, GENITAL
Clue Cells Wet Prep HPF POC: NONE SEEN
Sperm: NONE SEEN
Trich, Wet Prep: NONE SEEN
WBC, Wet Prep HPF POC: <10 (ref <10)
Yeast Wet Prep HPF POC: NONE SEEN

## 2024-05-12 MED ORDER — ONDANSETRON HCL 4 MG PO TABS
4.0000 mg | ORAL_TABLET | Freq: Once | ORAL | Status: AC
  Administered 2024-05-12: 4 mg via ORAL
  Filled 2024-05-12: qty 1

## 2024-05-12 MED ORDER — LACTATED RINGERS IV BOLUS
1000.0000 mL | Freq: Once | INTRAVENOUS | Status: AC
  Administered 2024-05-12: 1000 mL via INTRAVENOUS

## 2024-05-12 MED ORDER — ONDANSETRON HCL 4 MG PO TABS: 4.0000 mg | ORAL_TABLET | Freq: Three times a day (TID) | ORAL | 0 refills | Status: DC | PRN

## 2024-05-12 NOTE — MAU Note (Signed)
 Pt reports Nausea and pain has improved with Zofran . IV removed and ready for discharge.

## 2024-05-12 NOTE — Discharge Instructions (Addendum)
 FOR NAUSEA AND VOMITING: You can buy doxylamine (Brand is Unisom, but any brand is ok) 25mg  tablets and Vitamin B6 (usually sold in 100mg  tablets, any brand) daily -- take half a tablet of Unisom (12.5mg  dose) and a tablet of Vitamin B6 every night. **The Unisom may make you drowsy! Please do not take this if you are going to be operating heavy machinery**  Ginger can also be very helpful for nausea. Try an herbal ginger tea or lozenge.    Dos and Don'ts in Pregnancy 1. Prenatal Vitamins Pregnant women should consume the following each day through diet or supplements: o Folic acid 400-800 micrograms  o Iron 30 mg (or be screened for anemia) o Vitamin D 600 international units o Calcium 1,000 mg Prenatal vitamins are unlikely to be harmful. Therefore, they may be used to ensure adequate consumption of several vitamins and minerals in pregnancy. However, their necessity for all pregnant women is uncertain, especially for women with well-balanced diets. There is no known ideal formulation for a prenatal vitamin.  2. Nutrition and Weight Gain Pregnant women should eat a healthy, well-balanced diet and typically should increase their caloric intake by a small amount (350-450 calories/d). Typical weight gain goals vary based on pre-pregnancy body mass index (BMI)  3. Alcohol The exact threshold between safe and unsafe consumption of alcohol is unknown. Therefore, alcohol should be avoided in pregnancy.  4. Artificial Sweeteners Artificial sweeteners can be used in pregnancy. Data is conflicting. Low (typical) consumption of saccharin is likely safe.  5. Caffeine Low-to-moderate caffeine intake in pregnancy does not appear to be associated with any adverse outcomes. Pregnant women may have caffeine but should probably limit it to less than 300 mg/d (a typical 8-ounce cup of brewed coffee has approximately 130 mg of caffeine. An 8-ounce cup of tea or 12-ounce soda has approximately 50 mg of  caffeine), but exact amounts vary based on the specific beverage or food.  6. Fish Consumption Pregnant women should try to consume two to three servings per week of fish with a high DHA (docosahexaenoic acid) and low mercury content. In line with current recommendations, pregnant women should generally avoid undercooked fish. However, sushi that was prepared in a clean and reputable establishment is unlikely to pose a risk to the pregnancy.  7. Other Foods to Avoid Pregnant women should avoid raw and undercooked meat. Pregnant women should wash vegetables and fruit before eating them. Pregnant women should avoid unpasteurized dairy products. Unheated deli meats could also potentially increase the risk of Listeria, but the risk in recent years in uncertain. Pregnant women should avoid foods that are being recalled for possible Listeria contamination.  8. Smoking, Nicotine, and Vaping Women should not smoke cigarettes during pregnancy. If they are unable to quit entirely, they should reduce it as much as possible. Nicotine replacement (with patches or gum) is appropriate as part of a smoking cessation strategy.  9. Marijuana Marijuana use is not known to be associated with any adverse outcomes in pregnancy. However, data regarding long-term neurodevelopmental outcomes are lacking; therefore, marijuana use is currently not recommended in pregnancy.  10. Exercise and Bedrest Pregnant women are encouraged to exercise regularly. There is no known benefit to activity restriction or bedrest for pregnant women.  11. Avoiding Injury Pregnant women should wear lap and shoulder seatbelts while in a motor vehicle and should not disable their airbags.  12. Oral Health Oral health and dental procedures can continue as scheduled during pregnancy.  13. Hot Tubs  and Swimming Although data are limited, pregnant women should probably avoid hot tub use in the first trimester. Swimming pool use should  not be discouraged in pregnancy.  14. Insect Repellants Topical insect repellants (including DEET) can be used in pregnancy and should be used in areas with high risk for insect-borne illnesses.  15. Hair Dyes Although data are limited, because systemic absorption is minimal, hair dye is presumed to be safe in pregnancy.  16. Travel Airline travel is safe in pregnancy. Pregnant women should be familiar with the infection exposures and available medical care for each specific destination. There is no exact gestational age at which women must stop travel. Each pregnant woman must balance the benefit of the trip with the potential of a complication at her destination.  17. Sexual Intercourse Pregnant women without bleeding, placenta previa at greater than 20 weeks of gestation, or ruptured membranes should not have restrictions regarding sexual intercourse.  18. Sleeping Position It is currently unknown whether, and at what gestational age, pregnant women should be advised to sleep on their side.  Source: Brinda Rankin EDISON MD. Dos and Don'ts in Pregnancy: Truths and Myths. Obstetrics & Gynecology 131(4):p 286-278, April 2018.  DOI: 10.1097/AOG.9999999999997482

## 2024-05-12 NOTE — MAU Note (Signed)
 Christine Macdonald is a 28 y.o. at [redacted]w[redacted]d here in MAU reporting: cramping in her lower abd since 11 am. States she began bleeding at the same time and it is a moderate amount. Reports she has not been able to keep anything down for the last 2 days. Pt also reports dysuria that started today  Onset of complaint: 11 am Pain score: 8/10 Vitals:   05/12/24 1412  BP: 119/79  Pulse: (!) 117  Resp: 16  Temp: 98.8 F (37.1 C)  SpO2: 100%     FHT: unable to doppler  Lab orders placed from triage: ua

## 2024-05-12 NOTE — MAU Provider Note (Signed)
  History     CSN: [id number]  Arrival date and time: 05/12/24 1338   None     Chief Complaint  Patient presents with   Abdominal Pain   HPI Christine Macdonald is 28 y.o. [id number] at [redacted]w[redacted]d, 12/08/2024, by Ultrasound, presenting for VB + cramping. She reports several days of worsened nausea with new vomiting and nonbloody diarrhea. This morning, she noticed VB and then started cramping. VB to saturate most of a pad in 2 hours, but not soaked through; no clots or fetal tissues noted. She endorses h/o sAb, denies h/o ectopic pregnancy, and reports recent dx of subchorionic bleeding. She also notes new dysuria today.    ROS (+) VB, cramping, dysuria (-) LOF  Past Medical History:  Diagnosis Date   Medical history non-contributory     Past Surgical History:  Procedure Laterality Date   DILATION AND EVACUATION  10/28/2021   PP retained POC    History reviewed. No pertinent family history.  Social History   Tobacco Use   Smoking status: Never   Smokeless tobacco: Never  Vaping Use   Vaping status: Never Used  Substance Use Topics   Alcohol use: Not Currently   Drug use: Yes    Types: Cocaine, Other-see comments, Marijuana    Comment: Xanax, THC use 3 months ago    Allergies:  Allergies  Allergen Reactions   Quetiapine Shortness Of Breath    No medications prior to admission.    Review of Systems Per HPI  Physical Exam   Blood pressure 121/70, pulse (!) 106, temperature 98.8 F (37.1 C), resp. rate 16, last menstrual period 02/17/2024, SpO2 99%, unknown if currently breastfeeding.  Physical Exam Constitutional:      General: She is not in acute distress. Cardiovascular:     Rate and Rhythm: Regular rhythm. Tachycardia present.  Pulmonary:     Effort: Pulmonary effort is normal.     Breath sounds: Normal breath sounds.  Abdominal:     General: Abdomen is flat. Bowel sounds are normal.     Palpations: Abdomen is soft.     Tenderness: There is abdominal  tenderness (LLQ>RLQ>epigastric). There is no guarding.  Neurological:     Mental Status: She is alert.  Psychiatric:        Mood and Affect: Mood normal.    US  OB Transvaginal  IMPRESSION: Single live intrauterine pregnancy estimated gestational age based on crown rump length 10 weeks 6 days for ultrasound [facility] 12/02/2024. No subchorionic hemorrhage.  MAU Course  Procedures  MDM Ddx: subchorionic hemorrhage, sAb, ectopic pregnancy. Subhchorionic hemorrhage noted 7/8 is resolved. No leuks or nitrites on UA, UTI unlikely, specimen not sent for cx. Wet mount, GC/CT results pending at the time of DC.   Patient feeling improved with IVF and Zofran . Rx sent. Discussed return precautions for VB, pt expressed understanding. Discussed Unisom+B6 and ginger for nausea, info provided in AVS.  Assessment and Plan  1. Subchorionic hematoma in first trimester, single or unspecified fetus  2. [redacted] weeks gestation of pregnancy (Primary)  3. Vaginal bleeding in pregnancy, first trimester   [name] 05/12/2024, 6:55 PM

## 2024-05-13 ENCOUNTER — Ambulatory Visit: Payer: Self-pay

## 2024-05-13 ENCOUNTER — Other Ambulatory Visit: Payer: Self-pay | Admitting: Certified Nurse Midwife

## 2024-05-13 DIAGNOSIS — A749 Chlamydial infection, unspecified: Secondary | ICD-10-CM

## 2024-05-13 LAB — GC/CHLAMYDIA PROBE AMP (~~LOC~~) NOT AT ARMC
Chlamydia: POSITIVE — AB
Comment: NEGATIVE
Comment: NORMAL
Neisseria Gonorrhea: NEGATIVE

## 2024-05-13 MED ORDER — AZITHROMYCIN 500 MG PO TABS: 1000.0000 mg | ORAL_TABLET | Freq: Once | ORAL | 0 refills | Status: AC

## 2024-05-14 ENCOUNTER — Encounter: Admitting: Obstetrics & Gynecology

## 2024-05-19 ENCOUNTER — Ambulatory Visit

## 2024-05-23 ENCOUNTER — Ambulatory Visit (INDEPENDENT_AMBULATORY_CARE_PROVIDER_SITE_OTHER)

## 2024-05-23 ENCOUNTER — Other Ambulatory Visit (HOSPITAL_COMMUNITY)
Admission: RE | Admit: 2024-05-23 | Discharge: 2024-05-23 | Disposition: A | Discharge status: Home / Self Care | Source: Ambulatory Visit | Attending: Obstetrics and Gynecology | Admitting: Obstetrics and Gynecology

## 2024-05-23 ENCOUNTER — Encounter: Payer: Self-pay | Admitting: Obstetrics & Gynecology

## 2024-05-23 VITALS — BP 114/81 | HR 94 | Wt 127.5 lb

## 2024-05-23 DIAGNOSIS — Z113 Encounter for screening for infections with a predominantly sexual mode of transmission: Secondary | ICD-10-CM | POA: Insufficient documentation

## 2024-05-23 NOTE — Progress Notes (Signed)
..  SUBJECTIVE:  28 y.o. female is in the office for repeat STD testing. Pt tested positive for CT on 05/12/24 at MAU. Pt states that she does not think results are accurate and requesting repeat. Denies abnormal vaginal bleeding, discharge, or significant pelvic pain or fever. No UTI symptoms. Denies history of known exposure to STD.  Patient's last menstrual period was 02/17/2024 (exact date).  OBJECTIVE:  She appears well, afebrile. Urine dipstick: not done.  ASSESSMENT:  Repeat STD testing   PLAN:  GC, chlamydia, trichomonas, BVAG, CVAG probe sent to lab. Treatment: To be determined once lab results are received ROV prn if symptoms persist or worsen.

## 2024-05-26 ENCOUNTER — Ambulatory Visit: Payer: Self-pay | Admitting: Obstetrics and Gynecology

## 2024-05-26 LAB — CERVICOVAGINAL ANCILLARY ONLY
Bacterial Vaginitis (gardnerella): NEGATIVE
Candida Glabrata: NEGATIVE
Candida Vaginitis: NEGATIVE
Chlamydia: NEGATIVE
Comment: NEGATIVE
Comment: NEGATIVE
Comment: NEGATIVE
Comment: NEGATIVE
Comment: NEGATIVE
Comment: NORMAL
Neisseria Gonorrhea: NEGATIVE
Trichomonas: NEGATIVE

## 2024-05-29 LAB — PANORAMA PRENATAL TEST FULL PANEL:PANORAMA TEST PLUS 5 ADDITIONAL MICRODELETIONS
FETAL FRACTION: 2.1
REPORT SUMMARY: INCREASED — AB
TRIPLOIDY 13 18 RESULT TEXT: INCREASED — AB

## 2024-05-30 ENCOUNTER — Telehealth: Payer: Self-pay | Admitting: *Deleted

## 2024-05-30 NOTE — Telephone Encounter (Signed)
 TC to confirm that pt intends to receive prenatal care at [facility]. Upon chart review, New OB appt and US  noted with [facility] provider. Pt confirmed intention to transfer care to [facility] and will come for her scheduled appt on 06/02/24. Instructions to seek care in MAU for heavy vaginal bleeding or severe or worsening abdominal pain. Pt verbalized understanding.

## 2024-06-02 ENCOUNTER — Ambulatory Visit: Admitting: *Deleted

## 2024-06-02 ENCOUNTER — Other Ambulatory Visit (INDEPENDENT_AMBULATORY_CARE_PROVIDER_SITE_OTHER): Payer: Self-pay

## 2024-06-02 VITALS — BP 119/73 | HR 102 | Wt 126.0 lb

## 2024-06-02 DIAGNOSIS — Z3A13 13 weeks gestation of pregnancy: Secondary | ICD-10-CM

## 2024-06-02 DIAGNOSIS — Z3481 Encounter for supervision of other normal pregnancy, first trimester: Secondary | ICD-10-CM

## 2024-06-02 DIAGNOSIS — Z362 Encounter for other antenatal screening follow-up: Secondary | ICD-10-CM

## 2024-06-02 DIAGNOSIS — O0990 Supervision of high risk pregnancy, unspecified, unspecified trimester: Secondary | ICD-10-CM

## 2024-06-02 DIAGNOSIS — O0991 Supervision of high risk pregnancy, unspecified, first trimester: Secondary | ICD-10-CM

## 2024-06-02 MED ORDER — BLOOD PRESSURE KIT DEVI: 1.0000 | 0 refills | Status: AC

## 2024-06-02 NOTE — Progress Notes (Signed)
 New OB Intake  I connected with Christine Macdonald  on 06/02/24 at  3:10 PM EDT by in person and verified that I am speaking with the correct person using two identifiers. Nurse is located at [facility] and pt is located at [place].  I discussed the limitations, risks, security and privacy concerns of performing an evaluation and management service by telephone and the availability of in person appointments. I also discussed with the patient that there may be a patient responsible charge related to this service. The patient expressed understanding and agreed to proceed.  I explained I am completing New OB Intake today. We discussed EDD of 12/08/24 based on US  at 8 weeks. Pt is [id number]. I reviewed her allergies, medications and Medical/Surgical/OB history.    Patient Active Problem List   Diagnosis Date Noted   Subchorionic hematoma in first trimester 04/23/2024   Supervision of other normal pregnancy, antepartum 04/23/2024   History of premature rupture of membranes 06/13/2018     Concerns addressed today  Delivery Plans Plans to deliver at [facility]. Discussed the nature of our practice with multiple providers including residents and students as well as female and female providers. Due to the size of the practice, the delivering provider may not be the same as those providing prenatal care.   Patient is interested in water birth.  MyChart/Babyscripts MyChart access verified. I explained pt will have some visits in office and some virtually. Babyscripts instructions given and order placed. Patient verifies receipt of registration text/e-mail. Account successfully created and app downloaded. If patient is a candidate for Optimized scheduling, add to sticky note.   Blood Pressure Cuff/Weight Scale Blood pressure cuff ordered for patient to pick-up from [employer]. Explained after first prenatal appt pt will check weekly and document in Babyscripts. Patient does have weight scale.  Anatomy  US  Explained first scheduled US  will be around 19 weeks. Anatomy US  scheduled for [facility] at [facility].  Is patient a CenteringPregnancy candidate?  Not a Candidate   Is patient a Mom+Baby Combined Care candidate?  Declined   If accepted, confirm patient does not intend to move from the area for at least 12 months, then notify Mom+Baby staff  Is patient a candidate for Babyscripts Optimization? Yes, patient accepted    First visit review I reviewed new OB appt with patient. Explained pt will be seen by TBD at first visit. Discussed [place] genetic screening with patient. Collected Panorama. Routine prenatal labs is collected today.  Last Pap Diagnosis  Date Value Ref Range Status  07/23/2018 (A)  Final   ATYPICAL SQUAMOUS CELLS OF UNDETERMINED SIGNIFICANCE (ASC-US ).   Last pap was in 2024: Per pt WNL.   Duwaine LOISE Galla, RN 06/02/2024  3:46 PM

## 2024-06-03 LAB — HEMOGLOBIN A1C
Est. average glucose Bld gHb Est-mCnc: 94 mg/dL
Hgb A1c MFr Bld: 4.9 % (ref 4.8–5.6)

## 2024-06-10 ENCOUNTER — Ambulatory Visit: Payer: Self-pay | Admitting: Obstetrics and Gynecology

## 2024-06-10 DIAGNOSIS — O285 Abnormal chromosomal and genetic finding on antenatal screening of mother: Secondary | ICD-10-CM | POA: Insufficient documentation

## 2024-06-10 LAB — PANORAMA PRENATAL TEST FULL PANEL:PANORAMA TEST PLUS 5 ADDITIONAL MICRODELETIONS
FETAL FRACTION: 2.5
REPORT SUMMARY: INCREASED — AB
TRIPLOIDY 13 18 RESULT TEXT: INCREASED — AB

## 2024-06-16 ENCOUNTER — Ambulatory Visit

## 2024-06-16 ENCOUNTER — Ambulatory Visit: Attending: Obstetrics and Gynecology

## 2024-06-16 DIAGNOSIS — Z3A15 15 weeks gestation of pregnancy: Secondary | ICD-10-CM

## 2024-06-16 DIAGNOSIS — O285 Abnormal chromosomal and genetic finding on antenatal screening of mother: Secondary | ICD-10-CM

## 2024-06-16 NOTE — Progress Notes (Signed)
 [facility] at [facility] [address] Phone:  [phone]   Fax:  [phone]      In-Person Genetic Counseling Clinic Note:   I spoke with 28 y.o. Christine Macdonald today to discuss her NIPS results. She was referred by Alger Gong, MD. She was accompanied by FOB Gwendlyn.   Pregnancy History:    [id number]. EGA: [redacted]w[redacted]d by US . EDD: 12/08/2024. Christine Macdonald has three healthy children. She reports two SABs, no known genetic testing performed. She reports these were due to uterine complications. SABs can occur due to many different reasons including genetic causes. Some individuals are balanced translocation carriers which can increase risk for recurrent miscarriages and infertility. Further workup can be discussed if any changes arise.  Personal history of HTN. Declines major personal health concerns. Denies bleeding, infections, and fevers in this pregnancy. Denies using tobacco, alcohol, or street drugs in this pregnancy.   Family History:    A three-generation pedigree was created and scanned into Epic under the Media tab.  Patient reports her 35 yo father was diagnosed with colon cancer at 30 yo. Different types of cancer can have a hereditary component that increases an individual's susceptibility to develop cancer; however, most cancers occur by chance due to a combination of genetics and the environment. It is recommended Lainey inform her PCP about her family history so they can discuss appropriate screening and testing options, including a possible referral to cancer genetic counseling. She declined a referral to cancer genetics today.  Maternal ethnicity reported as Hispanic and paternal ethnicity reported as Black/Hispanic. Denies Ashkenazi Jewish ancestry.  Family history not remarkable for consanguinity, individuals with birth defects, intellectual disability, autism spectrum disorder, multiple spontaneous abortions, still  births, or unexplained neonatal death.   Abnormal NIPS:  Christine Macdonald had two abnormal noninvasive prenatal screening (NIPS) during this pregnancy.  Both results reported high risk for trisomy 31, trisomy 76, or triploidy due to insufficient fetal fraction. The lab reports a 1 in 17 risk for these conditions based on these results. Christine Macdonald's first NIPS was drawn at [redacted]w[redacted]d gestation and had a fetal fraction of 2.1%. Her second draw was at [redacted]w[redacted]d gestation and had a fetal fraction of 2.5%. Please see lab reports for details.   NIPS analyzes cell-free DNA originating from the placenta that is found in the maternal blood circulation during pregnancy to provide information regarding the presence or absence of extra DNA for chromosomes 13, 18, and 21 as well as the sex chromosomes. Low fetal fraction has been associated with early gestational age, high maternal weight, suboptimal sample collection, pregnancies conceived using in vitro fertilization, use of certain medications such as blood thinners, certain maternal health conditions such as autoimmune disorders, small placentas, fetal chromosomal abnormalities, and normal variation. When low fetal fraction is detected, the lab is not able to analyze the chromosome material from the placenta. Therefore, the lab then incorporates other factors, such as maternal weight, maternal age, and gestational age to further assess the risk of adverse conditions to the pregnancy. Based upon the results of the additional analysis, the current pregnancy is at high risk (1 in 29) for triploidy, trisomy 60, or trisomy 12. The risks for trisomy 78 and monosomy X are unchanged. The lab reports this increased risk due to the known association of those conditions with small placentas and low fetal fractions.   We discussed that it is currently unknown why Christine Macdonald has low fetal fraction. We  briefly discussed the three conditions that the lab reported the pregnancy is at an increased risk  for. Greater than 90% of cases with fetuses affected with trisomy 53, trisomy 14, or triploidy will show signs of the respective condition on ultrasound. Based on these results, we discussed further testing/screening options including repeat NIPS through [facility] or a different laboratory, diagnostic testing through amniocentesis, and monitoring through ultrasounds. The benefits and limitations of each were reviewed including the 1 in 500 risk for miscarriage by amniocentesis. We reviewed that no screen can act as a diagnostic tool, and low-risk NIPS and/or normal ultrasounds do not guarantee a healthy pregnancy. Moreover, other labs that offer NIPS are not able to screen for triploidy. The couple elected repeat NIPS through [facility].   Previous Testing Completed:  Negative carrier screening: Seng previously completed carrier screening. She screened to not be a carrier for 32 mutations for cystic fibrosis (CF) or spinal muscular atrophy (SMA). A negative result on carrier screening reduces but does not eliminate the chance of being a carrier. Her hemoglobin fractionation cascade was within normal limits; no hemoglobin variants or beta thalassemia were detected. This may not detect alpha thalassemia. Please see reports for details.   Plan of Care:   Repeat NIPS through [facility] was drawn today. Declined amniocentesis at the moment. MFC anatomy ultrasound on 07/14/2024.   Informed consent was obtained. All questions were answered.   70 minutes were spent on the date of the encounter in service to the patient including preparation, face-to-face consultation, discussion of test reports and available next steps, pedigree construction, genetic risk assessment, documentation, and care coordination.    Thank you for sharing in the care of Christine Macdonald with us .  Please do not hesitate to contact us  at [phone] if you have any questions.   Lauraine Bodily, MS, [facility] Certified Genetic Counselor   Genetic counseling  student involved in appointment: No.

## 2024-06-18 ENCOUNTER — Encounter: Payer: Self-pay | Admitting: Obstetrics and Gynecology

## 2024-06-18 ENCOUNTER — Other Ambulatory Visit (HOSPITAL_COMMUNITY)
Admission: RE | Admit: 2024-06-18 | Discharge: 2024-06-18 | Disposition: A | Discharge status: Home / Self Care | Source: Ambulatory Visit | Attending: Obstetrics and Gynecology | Admitting: Obstetrics and Gynecology

## 2024-06-18 ENCOUNTER — Ambulatory Visit (INDEPENDENT_AMBULATORY_CARE_PROVIDER_SITE_OTHER): Admitting: Obstetrics and Gynecology

## 2024-06-18 VITALS — BP 119/78 | HR 99 | Wt 126.0 lb

## 2024-06-18 DIAGNOSIS — O0990 Supervision of high risk pregnancy, unspecified, unspecified trimester: Secondary | ICD-10-CM | POA: Diagnosis present

## 2024-06-18 DIAGNOSIS — Z8759 Personal history of other complications of pregnancy, childbirth and the puerperium: Secondary | ICD-10-CM | POA: Diagnosis not present

## 2024-06-18 DIAGNOSIS — I10 Essential (primary) hypertension: Secondary | ICD-10-CM | POA: Diagnosis not present

## 2024-06-18 DIAGNOSIS — Z3A15 15 weeks gestation of pregnancy: Secondary | ICD-10-CM

## 2024-06-18 DIAGNOSIS — Z3480 Encounter for supervision of other normal pregnancy, unspecified trimester: Secondary | ICD-10-CM

## 2024-06-18 DIAGNOSIS — O285 Abnormal chromosomal and genetic finding on antenatal screening of mother: Secondary | ICD-10-CM

## 2024-06-18 MED ORDER — PRENATAL 28-0.8 MG PO TABS: 1.0000 | ORAL_TABLET | Freq: Every day | ORAL | 12 refills | Status: AC

## 2024-06-18 MED ORDER — ASPIRIN 81 MG PO TBEC: 81.0000 mg | DELAYED_RELEASE_TABLET | Freq: Every day | ORAL | 2 refills | Status: AC

## 2024-06-18 NOTE — Progress Notes (Signed)
 INITIAL PRENATAL VISIT  Subjective:   Christine Macdonald is being seen today for her first obstetrical visit.   She is at [redacted]w[redacted]d gestation by ultrasound Her obstetrical history is significant for hx ghtn. Relationship with FOB: present.Pregnancy history fully reviewed.  Patient reports questions about pregnancy and movement .  Indications for ASA therapy (per uptodate)  Objective:    Obstetric History OB History  Gravida Para Term Preterm AB Living  6 3 3  2 3   SAB IAB Ectopic Multiple Live Births  2 0   3    # Outcome Date GA Lbr Len/2nd Weight Sex Type Anes PTL Lv  6 Current           5 SAB 02/2022 [redacted]w[redacted]d       ND  4 Term 10/23/21 [redacted]w[redacted]d 12:46 / 00:15 6 lb 13.5 oz (3.103 kg) M Vag-Spont EPI N LIV  3 Term 03/09/20 [redacted]w[redacted]d 05:23 / 00:27 7 lb 3.9 oz (3.285 kg) F Vag-Spont EPI N LIV     Complications: Pregnancy-induced hypertension, Postpartum hemorrhage  2 Term 06/14/18 [redacted]w[redacted]d 03:45 / 02:09 5 lb 8.9 oz (2.52 kg) M Vag-Spont EPI  LIV  1 SAB             Past Medical History:  Diagnosis Date   Gestational hypertension    Medical history non-contributory     Past Surgical History:  Procedure Laterality Date   DILATION AND EVACUATION  10/28/2021   PP retained POC    Current Outpatient Medications on File Prior to Visit  Medication Sig Dispense Refill   Blood Pressure Monitoring (BLOOD PRESSURE KIT) DEVI 1 Device by Does not apply route once a week. 1 each 0   cyclobenzaprine  (FLEXERIL ) 5 MG tablet Take 1 tablet (5 mg total) by mouth 3 (three) times daily as needed for muscle spasms. (Patient not taking: Reported on 06/18/2024) 20 tablet 0   ondansetron  (ZOFRAN ) 4 MG tablet Take 1 tablet (4 mg total) by mouth every 8 (eight) hours as needed for nausea or vomiting. (Patient not taking: Reported on 06/18/2024) 12 tablet 0   No current facility-administered medications on file prior to visit.    Allergies  Allergen Reactions   Quetiapine Shortness Of Breath    Social  History:  reports that she has never smoked. She has never used smokeless tobacco. She reports that she does not currently use alcohol. She reports that she does not currently use drugs after having used the following drugs: Cocaine, Other-see comments, and Marijuana.  Family History  Problem Relation Age of Onset   Cancer Father     The following portions of the patient's history were reviewed and updated as appropriate: allergies, current medications, past family history, past medical history, past social history, past surgical history and problem list.  Review of Systems Review of Systems  All other systems reviewed and are negative.     Physical Exam:  BP 119/78   Pulse 99   Wt 126 lb (57.2 kg)   LMP 02/17/2024 (Exact Date)   BMI 23.05 kg/m  CONSTITUTIONAL: Well-developed, well-nourished female in no acute distress.  HENT:  Normocephalic, atraumatic.   NECK: Normal range of motion SKIN: Skin is warm and dry. MUSCULOSKELETAL: Normal range of motion NEUROLOGIC: Alert and oriented  PSYCHIATRIC: Normal mood and affect. Normal behavior.  CARDIOVASCULAR: Normal heart rate noted RESPIRATORY: normal effort ABDOMEN: Soft PELVIC:Pelvic: normal appearing vulva with no masses, tenderness or lesions  VAGINA: normal appearing vagina with normal color and  discharge, no lesions  CERVIX: normal appearing cervix without discharge or lesions, no CMT  Thin prep pap is done   Extremities:  No swelling or varicosities noted   Fetal Heart Rate (bpm): 155   Movement: Present       Assessment:    Pregnancy: [id number]  1. Supervision of high risk pregnancy, antepartum (Primary) FHR movement, bedside u/s FHR and movement showed to patient  Desires BTL, discussed consent at 28 weeks, will schedule with MD - Cytology - PAP( [place])  2. Abnormal chromosomal and genetic finding on antenatal screening mother Abnormal panorama,,inc risk T13/18, low fetal fraction, seen by genetic counselor,  awaiting repeat panorama, declined amnio  3. [redacted] weeks gestation of pregnancy  - AFP, Serum, Open Spina Bifida - aspirin  EC 81 MG tablet; Take 1 tablet (81 mg total) by mouth daily. Start taking when you are [redacted] weeks pregnant for rest of pregnancy for prevention of preeclampsia  Dispense: 300 tablet; Refill: 2 - Prenatal 28-0.8 MG TABS; Take 1 tablet by mouth daily.  Dispense: 30 tablet; Refill: 12  4. History of gestational hypertension 5. Chronic hypertension Hx in all pregnancies, did not have to take medication PEC labs drawn at [facility] Reports being told HTN outside of pregnancy as well    Plan:     Initial labs drawn. Prenatal vitamins. Problem list reviewed and updated. Reviewed in detail the nature of the practice with collaborative care between  Genetic screening discussed: NIPS/First trimester screen/Quad/AFP results reviewed. Role of ultrasound in pregnancy discussed; Anatomy US : ordered.  Discussed clinic routines, schedule of care and testing, genetic screening options, involvement of students and residents under the direct supervision of APPs and doctors and presence of female providers. Pt verbalized understanding.   Delores Nidia CROME, FNP

## 2024-06-18 NOTE — Progress Notes (Signed)
 Pt presents for [facility] visit. Last PAP 2024 with [facility]. Normal PAP  Requesting PAP today

## 2024-06-19 ENCOUNTER — Ambulatory Visit: Payer: Self-pay | Admitting: Obstetrics and Gynecology

## 2024-06-19 LAB — CYTOLOGY - PAP: Diagnosis: NEGATIVE

## 2024-06-20 ENCOUNTER — Encounter: Payer: Self-pay | Admitting: Obstetrics and Gynecology

## 2024-06-20 LAB — AFP, SERUM, OPEN SPINA BIFIDA
AFP MoM: 1.07
AFP Value: 35 ng/mL
Gest. Age on Collection Date: 15 wk
Maternal Age At EDD: 30.3 a
OSBR Risk 1 IN: 9862
Test Results:: NEGATIVE
Weight: 126 [lb_av]

## 2024-06-23 ENCOUNTER — Telehealth: Payer: Self-pay

## 2024-06-23 NOTE — Telephone Encounter (Signed)
 I spoke with the patient to return her recent [facility] noninvasive prenatal screening (NIPS) results. The result is low risk, consistent with a female fetus. This screening significantly reduces but does not eliminate the chance that the current pregnancy has Down syndrome (trisomy 8), trisomy 60, trisomy 44, and common sex chromosome conditions. Please see report for details. There are many genetic conditions that cannot be detected by NIPS.  Lauraine Bodily, MS, [facility] Certified Genetic Counselor [facility] for [facility] [phone]

## 2024-06-27 ENCOUNTER — Encounter: Payer: Self-pay | Admitting: Obstetrics and Gynecology

## 2024-07-03 ENCOUNTER — Encounter: Payer: Self-pay | Admitting: Obstetrics and Gynecology

## 2024-07-14 ENCOUNTER — Encounter: Payer: Self-pay | Admitting: Obstetrics and Gynecology

## 2024-07-14 ENCOUNTER — Other Ambulatory Visit

## 2024-07-14 ENCOUNTER — Ambulatory Visit

## 2024-07-14 ENCOUNTER — Encounter: Admitting: Obstetrics and Gynecology

## 2024-07-14 DIAGNOSIS — O99322 Drug use complicating pregnancy, second trimester: Secondary | ICD-10-CM | POA: Insufficient documentation

## 2024-07-14 DIAGNOSIS — Z3480 Encounter for supervision of other normal pregnancy, unspecified trimester: Secondary | ICD-10-CM

## 2024-07-15 ENCOUNTER — Ambulatory Visit (INDEPENDENT_AMBULATORY_CARE_PROVIDER_SITE_OTHER): Admitting: Obstetrics and Gynecology

## 2024-07-15 ENCOUNTER — Ambulatory Visit

## 2024-07-15 ENCOUNTER — Other Ambulatory Visit

## 2024-07-15 VITALS — BP 115/72 | HR 109 | Wt 129.0 lb

## 2024-07-15 DIAGNOSIS — Z3A19 19 weeks gestation of pregnancy: Secondary | ICD-10-CM

## 2024-07-15 DIAGNOSIS — Z3480 Encounter for supervision of other normal pregnancy, unspecified trimester: Secondary | ICD-10-CM

## 2024-07-15 DIAGNOSIS — Z8759 Personal history of other complications of pregnancy, childbirth and the puerperium: Secondary | ICD-10-CM

## 2024-07-15 DIAGNOSIS — O99322 Drug use complicating pregnancy, second trimester: Secondary | ICD-10-CM | POA: Diagnosis not present

## 2024-07-15 DIAGNOSIS — O285 Abnormal chromosomal and genetic finding on antenatal screening of mother: Secondary | ICD-10-CM

## 2024-07-15 NOTE — Patient Instructions (Signed)
   Considering Waterbirth? Guide for patients at Center for [facility]) Why consider waterbirth? Gentle birth for babies  Less pain medicine used in labor  May allow for passive descent/less pushing  May reduce perineal tears  More mobility and instinctive maternal position changes  Increased maternal relaxation   Is waterbirth safe? What are the risks of infection, drowning or other complications? Infection:  Very low risk (3.7 % for tub vs 4.8% for bed)  7 in 8000 waterbirths with documented infection  Poorly cleaned equipment most common cause  Slightly lower group B strep transmission rate  Drowning  Maternal:  Very low risk  Related to seizures or fainting  Newborn:  Very low risk. No evidence of increased risk of respiratory problems in multiple large studies  Physiological protection from breathing under water  Avoid underwater birth if there are any fetal complications  Once baby's head is out of the water, keep it out.  Birth complication  Some reports of cord trauma, but risk decreased by bringing baby to surface gradually  No evidence of increased risk of shoulder dystocia. Mothers can usually change positions faster in water than in a bed, possibly aiding the maneuvers to free the shoulder.   There are 2 things you MUST do to have a waterbirth with [facility]: Attend a waterbirth class at [facility] at [facility]   3rd Wednesday of every month from 7-9 pm (virtual during COVID) [employer] at [email or web address] or [email or web address] or by calling [phone] Bring us  the certificate from the class to your prenatal appointment or send via MyChart Meet with a midwife at 36 weeks* to see if you can still plan a waterbirth and to sign the consent.   *We also recommend that you schedule as many of your prenatal visits with a midwife as possible.    Helpful information: You may want to bring a bathing suit top to the hospital  to wear during labor but this is optional.  All other supplies are provided by the hospital. Please arrive at the hospital with signs of active labor, and do not wait at home until late in labor. It takes 45 min- 1 hour for fetal monitoring, and check in to your room to take place, plus transport and filling of the waterbirth tub.    Things that would prevent you from having a waterbirth: Premature, <37wks  Previous cesarean birth  Presence of thick meconium-stained fluid  Multiple gestation (Twins, triplets, etc.)  Uncontrolled diabetes or gestational diabetes requiring medication  Hypertension diagnosed in pregnancy or preexisting hypertension (gestational hypertension, preeclampsia, or chronic hypertension) Fetal growth restriction (your baby measures less than 10th percentile on ultrasound) Heavy vaginal bleeding  Non-reassuring fetal heart rate  Active infection (MRSA, etc.). Group B Strep is NOT a contraindication for waterbirth.  If your labor has to be induced and induction method requires continuous monitoring of the baby's heart rate  Other risks/issues identified by your obstetrical provider   Please remember that birth is unpredictable. Under certain unforeseeable circumstances your provider may advise against giving birth in the tub. These decisions will be made on a case-by-case basis and with the safety of you and your baby as our highest priority.    Updated 01/11/22

## 2024-07-15 NOTE — Progress Notes (Signed)
   PRENATAL VISIT NOTE  Subjective:  Christine Macdonald is a 28 y.o. [phone] at [redacted]w[redacted]d being seen today for ongoing prenatal care.  She is currently monitored for the following issues for this high-risk pregnancy and has History of premature rupture of membranes; Supervision of other normal pregnancy, antepartum; Abn Panorama X 2 (low FF), [facility] NIPS: low risk female; Chronic hypertension; + Cocaine and marijuana on drug screen 05/21/24; and History of gestational hypertension on their problem list.  Patient doing well with no acute concerns today. She reports no complaints.  Contractions: Not present. Vag. Bleeding: None.   . Denies leaking of fluid. Pt notes interest in waterbirth  The following portions of the patient's history were reviewed and updated as appropriate: allergies, current medications, past family history, past medical history, past social history, past surgical history and problem list. Problem list updated.  Objective:   Vitals:   07/15/24 1309  BP: 115/72  Pulse: (!) 109  Weight: 129 lb (58.5 kg)    Fetal Status: Fetal Heart Rate (bpm): 152         General:  Alert, oriented and cooperative. Patient is in no acute distress.  Skin: Skin is warm and dry. No rash noted.   Cardiovascular: Normal heart rate noted  Respiratory: Normal respiratory effort, no problems with respiration noted  Abdomen: Soft, gravid, appropriate for gestational age.  Pain/Pressure: Absent     Pelvic: Cervical exam deferred        Extremities: Normal range of motion.  Edema: None  Mental Status:  Normal mood and affect. Normal behavior. Normal judgment and thought content.   Assessment and Plan:  Pregnancy: [id number] at [redacted]w[redacted]d  1. Supervision of other normal pregnancy, antepartum (Primary) Continue routine prenatal care  2. [redacted] weeks gestation of pregnancy   3. + Cocaine and marijuana on drug screen 05/21/24 Pt notes all use was prior to pregnancy, discussed [facility] clinic, pt declines at  this time  4. Abn Panorama X 2 (low FF), [facility] NIPS: low risk female   5. History of gestational hypertension Will watch, but currently no increased BP this preg, she is aware if there is HTN later in pregnancy, it would risk her out of waterbirth  6. History of premature rupture of membranes No s/sx of PROM  Preterm labor symptoms and general obstetric precautions including but not limited to vaginal bleeding, contractions, leaking of fluid and fetal movement were reviewed in detail with the patient.  Please refer to After Visit Summary for other counseling recommendations.   Return in about 4 weeks (around 08/12/2024) for [facility], in person.   Jerilynn Buddle, MD Faculty Attending Center for [facility]

## 2024-07-16 ENCOUNTER — Other Ambulatory Visit

## 2024-07-16 ENCOUNTER — Ambulatory Visit: Attending: Obstetrics and Gynecology | Admitting: Obstetrics and Gynecology

## 2024-07-16 ENCOUNTER — Other Ambulatory Visit: Payer: Self-pay | Admitting: *Deleted

## 2024-07-16 ENCOUNTER — Encounter: Payer: Self-pay | Admitting: Obstetrics and Gynecology

## 2024-07-16 VITALS — BP 108/68

## 2024-07-16 DIAGNOSIS — O09292 Supervision of pregnancy with other poor reproductive or obstetric history, second trimester: Secondary | ICD-10-CM | POA: Diagnosis not present

## 2024-07-16 DIAGNOSIS — O285 Abnormal chromosomal and genetic finding on antenatal screening of mother: Secondary | ICD-10-CM | POA: Insufficient documentation

## 2024-07-16 DIAGNOSIS — F1490 Cocaine use, unspecified, uncomplicated: Secondary | ICD-10-CM | POA: Diagnosis not present

## 2024-07-16 DIAGNOSIS — O99322 Drug use complicating pregnancy, second trimester: Secondary | ICD-10-CM | POA: Insufficient documentation

## 2024-07-16 DIAGNOSIS — Z3A19 19 weeks gestation of pregnancy: Secondary | ICD-10-CM | POA: Insufficient documentation

## 2024-07-16 DIAGNOSIS — F1290 Cannabis use, unspecified, uncomplicated: Secondary | ICD-10-CM | POA: Diagnosis not present

## 2024-07-16 DIAGNOSIS — O0990 Supervision of high risk pregnancy, unspecified, unspecified trimester: Secondary | ICD-10-CM

## 2024-07-16 DIAGNOSIS — O358XX0 Maternal care for other (suspected) fetal abnormality and damage, not applicable or unspecified: Secondary | ICD-10-CM

## 2024-07-16 DIAGNOSIS — Z3480 Encounter for supervision of other normal pregnancy, unspecified trimester: Secondary | ICD-10-CM

## 2024-07-16 DIAGNOSIS — Z363 Encounter for antenatal screening for malformations: Secondary | ICD-10-CM | POA: Diagnosis not present

## 2024-07-16 NOTE — Progress Notes (Signed)
 Maternal-Fetal Medicine Consultation  Name: Christine Macdonald  MRN: [id number]  GA: [id number] [redacted]w[redacted]d   Patient is here for fetal anatomy scan. On cell-free fetal DNA screening ([facility]), the risks of aneuploidies are not increased. MSAFP screening showed low risk for open-neural tube defects. Obstetrical history is significant for for a preterm followed by 2 term vaginal deliveries.  Her first pregnancy was complicated by preterm premature rupture of membranes and she delivered at [redacted] weeks gestation.  In a second pregnancy, she had postpartum hemorrhage but did not require transfusion. In her third pregnancy she had retained placenta and had to undergo D&C procedure. Her first and the second pregnancy were complicated by gestational hypertension/preeclampsia.  Patient takes low-dose aspirin  prophylaxis. Same partner and all her pregnancies. Her pregnancy is dated by 6-week ultrasound. Ultrasound Normal fetal biometry and amniotic fluid. No markers of aneuploidies or obvious fetal structural defects are seen. Patient understands the limitations of ultrasound in detecting fetal anomalies.  Insufficient fetal DNA on initial screening Patient had genetic counseling and had repeat screening ([facility]) that showed low risk for fetal aneuploidies.  I discussed the significance and limitations of cell-free fetal DNA screening.  I counseled the patient that only amniocentesis will give a definitive result on the fetal karyotype.  I explained the procedure and possible complication of miscarriage (1 in 500 procedures).  Patient opted not to have amniocentesis.  History of gestational hypertension/preeclampsia I counseled patient that recurrence of preeclampsia is seen in 25% to 50% of cases.  I discussed the benefit of low-dose aspirin  prophylaxis that delays or prevents preeclampsia.  I encouraged her to continue low-dose aspirin  prophylaxis.  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 Marijuana is the most-commonly used recreational drug in pregnancy. About 7% to 10% of pregnant women take cannabis. Some patients use to treat anxiety, depression, nausea and vomiting. A small increase in preterm delivery and neonatal intensive care admissions were reported. No increase in fetal congenital malformations were reported with cannabis. Fetal brain has THC receptor and long-term effects are not known. Concomitant use of tobacco or other drugs increase the risk of pregnancy adverse outcomes.   In consistent with ACOG recommendations, cannabis should be avoided in pregnancy. I counseled the patient that cocaine use can cause hypertension and complications including placental abruption.  Cocaine should not be taken in pregnancy.  Recommendations - An appointment was made for her to return in 4 weeks for completion of fetal anatomy (including CSP).     Consultation including face-to-face (more than 50%) counseling 45 minutes.

## 2024-07-24 ENCOUNTER — Encounter: Payer: Self-pay | Admitting: Obstetrics and Gynecology

## 2024-07-25 ENCOUNTER — Telehealth: Payer: Self-pay

## 2024-07-25 ENCOUNTER — Other Ambulatory Visit: Payer: Self-pay

## 2024-07-25 DIAGNOSIS — Z3480 Encounter for supervision of other normal pregnancy, unspecified trimester: Secondary | ICD-10-CM

## 2024-08-13 ENCOUNTER — Ambulatory Visit: Attending: Obstetrics and Gynecology

## 2024-08-13 ENCOUNTER — Ambulatory Visit

## 2024-08-13 DIAGNOSIS — Z3480 Encounter for supervision of other normal pregnancy, unspecified trimester: Secondary | ICD-10-CM

## 2024-08-14 ENCOUNTER — Encounter: Admitting: Obstetrics and Gynecology

## 2024-08-21 ENCOUNTER — Encounter: Admitting: Obstetrics and Gynecology

## 2024-08-25 ENCOUNTER — Ambulatory Visit

## 2024-08-28 ENCOUNTER — Encounter: Payer: Self-pay | Admitting: Licensed Clinical Social Worker

## 2024-09-02 ENCOUNTER — Encounter: Payer: Self-pay | Admitting: Licensed Clinical Social Worker

## 2024-09-15 ENCOUNTER — Ambulatory Visit

## 2024-09-15 ENCOUNTER — Ambulatory Visit: Attending: Obstetrics and Gynecology

## 2024-10-13 ENCOUNTER — Encounter: Payer: Self-pay | Admitting: Obstetrics and Gynecology

## 2024-10-13 ENCOUNTER — Telehealth: Payer: Self-pay

## 2024-10-14 ENCOUNTER — Other Ambulatory Visit

## 2024-10-21 ENCOUNTER — Ambulatory Visit

## 2024-10-27 ENCOUNTER — Encounter: Payer: Self-pay | Admitting: Obstetrics

## 2024-11-03 ENCOUNTER — Ambulatory Visit

## 2024-11-03 ENCOUNTER — Ambulatory Visit: Attending: Obstetrics and Gynecology

## 2024-12-08 ENCOUNTER — Inpatient Hospital Stay (HOSPITAL_COMMUNITY): Admit: 2024-12-08
